# Patient Record
Sex: Male | Born: 1990 | Race: White | Hispanic: No | Marital: Single | Smoking: Former smoker
Health system: Southern US, Community
[De-identification: ages and names within clinical notes are randomized; demographics above are authoritative.]

---

## 2012-09-25 ENCOUNTER — Ambulatory Visit: Payer: Self-pay

## 2012-09-25 ENCOUNTER — Other Ambulatory Visit: Payer: Self-pay | Admitting: Occupational Medicine

## 2012-09-25 DIAGNOSIS — R52 Pain, unspecified: Secondary | ICD-10-CM

## 2013-05-06 ENCOUNTER — Encounter (INDEPENDENT_AMBULATORY_CARE_PROVIDER_SITE_OTHER): Payer: Self-pay

## 2013-05-06 ENCOUNTER — Encounter: Payer: Self-pay | Admitting: Family Medicine

## 2013-05-06 ENCOUNTER — Ambulatory Visit (INDEPENDENT_AMBULATORY_CARE_PROVIDER_SITE_OTHER): Payer: BC Managed Care – PPO | Admitting: Family Medicine

## 2013-05-06 VITALS — BP 102/64 | HR 114 | Temp 99.3°F | Ht 70.5 in | Wt 193.0 lb

## 2013-05-06 DIAGNOSIS — J111 Influenza due to unidentified influenza virus with other respiratory manifestations: Secondary | ICD-10-CM

## 2013-05-06 DIAGNOSIS — R509 Fever, unspecified: Secondary | ICD-10-CM

## 2013-05-06 DIAGNOSIS — R059 Cough, unspecified: Secondary | ICD-10-CM

## 2013-05-06 DIAGNOSIS — J029 Acute pharyngitis, unspecified: Secondary | ICD-10-CM

## 2013-05-06 DIAGNOSIS — R05 Cough: Secondary | ICD-10-CM

## 2013-05-06 DIAGNOSIS — R52 Pain, unspecified: Secondary | ICD-10-CM

## 2013-05-06 LAB — POCT INFLUENZA A/B
Influenza A, POC: NEGATIVE
Influenza B, POC: NEGATIVE

## 2013-05-06 LAB — POCT RAPID STREP A (OFFICE): Rapid Strep A Screen: NEGATIVE

## 2013-05-06 MED ORDER — AZITHROMYCIN 250 MG PO TABS: ORAL_TABLET | ORAL | Status: DC

## 2013-05-06 MED ORDER — OSELTAMIVIR PHOSPHATE 75 MG PO CAPS: 75.0000 mg | ORAL_CAPSULE | Freq: Two times a day (BID) | ORAL | Status: DC

## 2013-05-06 NOTE — Progress Notes (Signed)
   Subjective:    Patient ID: Alex Joseph, male    DOB: [birth year], 23 y.o.   MRN: [id number]  HPI This 23 y.o. male presents for evaluation of fever, malaise, and uri sx's.   Review of Systems No chest pain, SOB, HA, dizziness, vision change, N/V, diarrhea, constipation, dysuria, urinary urgency or frequency, myalgias, arthralgias or rash.     Objective:   Physical Exam  Vital signs noted  Well developed well nourished male.  HEENT - Head atraumatic Normocephalic                Eyes - PERRLA, Conjuctiva - clear Sclera- Clear EOMI                Ears - EAC's Wnl TM's Wnl Gross Hearing WNL                Nose - Nares patent                 Throat - oropharanx wnl Respiratory - Lungs CTA bilateral Cardiac - RRR S1 and S2 without murmur GI - Abdomen soft Nontender and bowel sounds active x 4 Extremities - No edema. Neuro - Grossly intact.      Assessment & Plan:  Cough - Plan: POCT Influenza A/B, POCT rapid strep A, oseltamivir (TAMIFLU) 75 MG capsule, azithromycin (ZITHROMAX) 250 MG tablet  Sore throat - Plan: POCT Influenza A/B, POCT rapid strep A, oseltamivir (TAMIFLU) 75 MG capsule, azithromycin (ZITHROMAX) 250 MG tablet  Fever - Plan: POCT Influenza A/B, POCT rapid strep A, oseltamivir (TAMIFLU) 75 MG capsule, azithromycin (ZITHROMAX) 250 MG tablet  Body aches - Plan: POCT Influenza A/B, POCT rapid strep A, oseltamivir (TAMIFLU) 75 MG capsule, azithromycin (ZITHROMAX) 250 MG tablet  Influenza with other respiratory manifestations - Plan: oseltamivir (TAMIFLU) 75 MG capsule, azithromycin (ZITHROMAX) 250 MG tablet  Push po fluids, rest, tylenol and motrin otc prn as directed for fever, arthralgias, and myalgias.  Follow up prn if sx's continue or persist.  Deatra CanterWilliam J Nyilah Kight FNP

## 2013-10-09 ENCOUNTER — Ambulatory Visit (INDEPENDENT_AMBULATORY_CARE_PROVIDER_SITE_OTHER): Payer: BC Managed Care – PPO | Admitting: Family Medicine

## 2013-10-09 VITALS — BP 128/76 | HR 76 | Temp 98.9°F | Ht 70.5 in | Wt 181.2 lb

## 2013-10-09 DIAGNOSIS — L03311 Cellulitis of abdominal wall: Secondary | ICD-10-CM

## 2013-10-09 DIAGNOSIS — L02219 Cutaneous abscess of trunk, unspecified: Secondary | ICD-10-CM

## 2013-10-09 DIAGNOSIS — L03319 Cellulitis of trunk, unspecified: Secondary | ICD-10-CM

## 2013-10-09 MED ORDER — DOXYCYCLINE HYCLATE 100 MG PO TABS: 100.0000 mg | ORAL_TABLET | Freq: Two times a day (BID) | ORAL | Status: DC

## 2013-10-09 NOTE — Progress Notes (Signed)
   Subjective:    Patient ID: Alex Joseph, male    DOB: 1990-04-14, 23 y.o.   MRN: [id number]  HPI This 23 y.o. male presents for evaluation of sore on left lower abdomen and hip area.  He states he wears a belt and has an infection now at this area.   Review of Systems    No chest pain, SOB, HA, dizziness, vision change, N/V, diarrhea, constipation, dysuria, urinary urgency or frequency, myalgias, arthralgias or rash.  Objective:   Physical Exam  Skin - Left lower abdomen with erythematous region w/o fluctuance      Assessment & Plan:  Cellulitis of abdominal wall - Plan: doxycycline (VIBRA-TABS) 100 MG tablet Po bid x10 days  Deatra CanterWilliam J Kymberlyn Eckford FNP

## 2014-12-23 ENCOUNTER — Ambulatory Visit: Payer: Self-pay | Admitting: Family

## 2015-03-07 IMAGING — CR DG ANKLE COMPLETE 3+V*R*
3 series · 3 of 3 positions shown · non-contrast
Comparison: None

CLINICAL DATA: Twisted right ankle today, lateral pain

RIGHT ANKLE - COMPLETE 3+ VIEW

[view not recorded (1 of 3)]
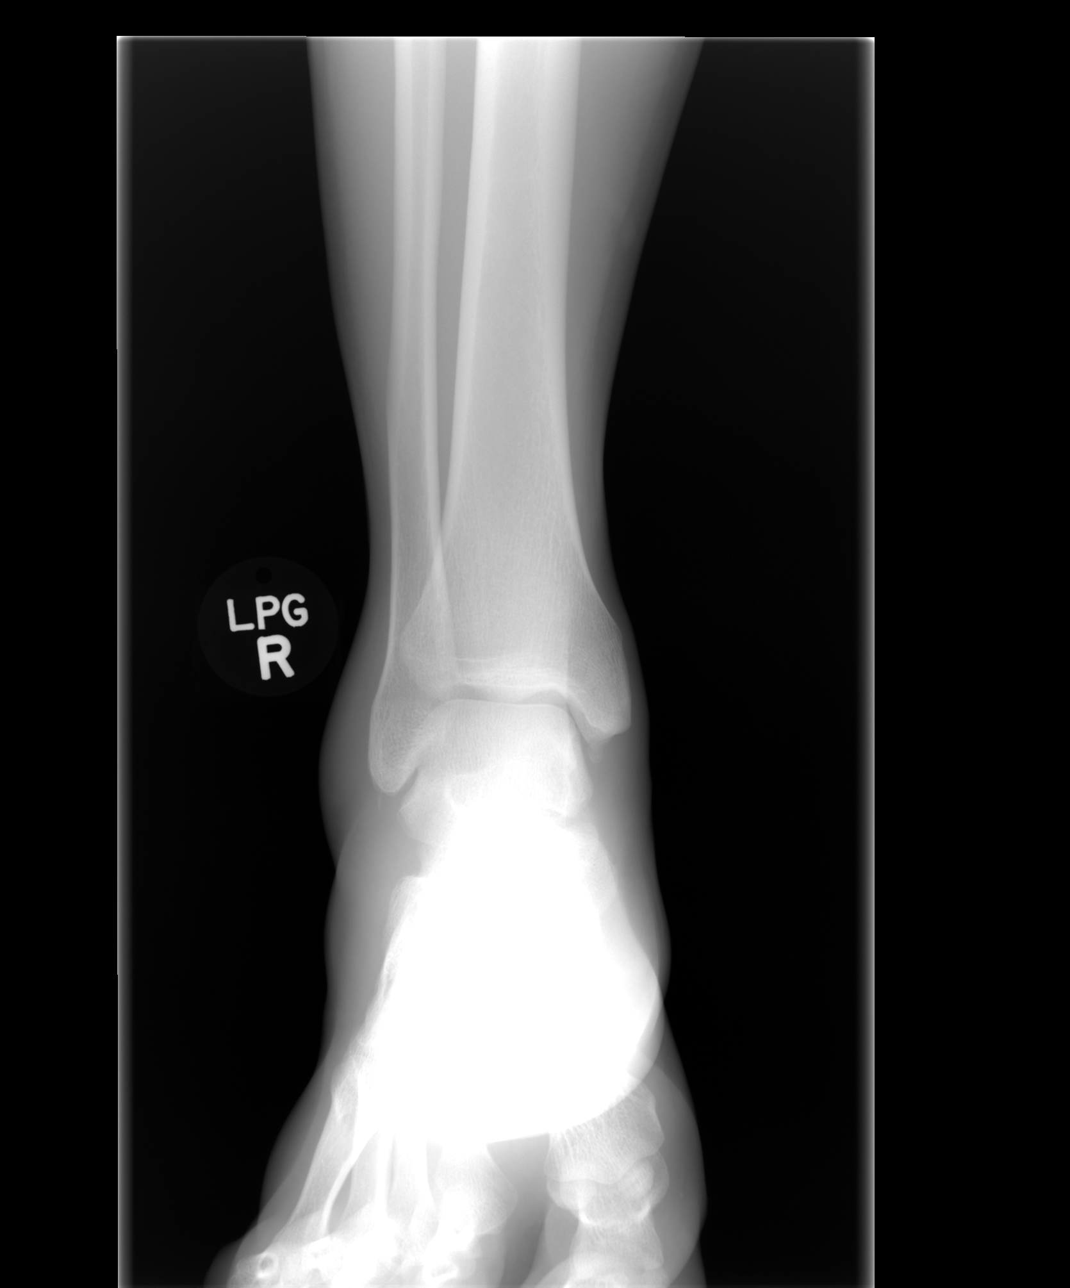

[view not recorded (2 of 3)]
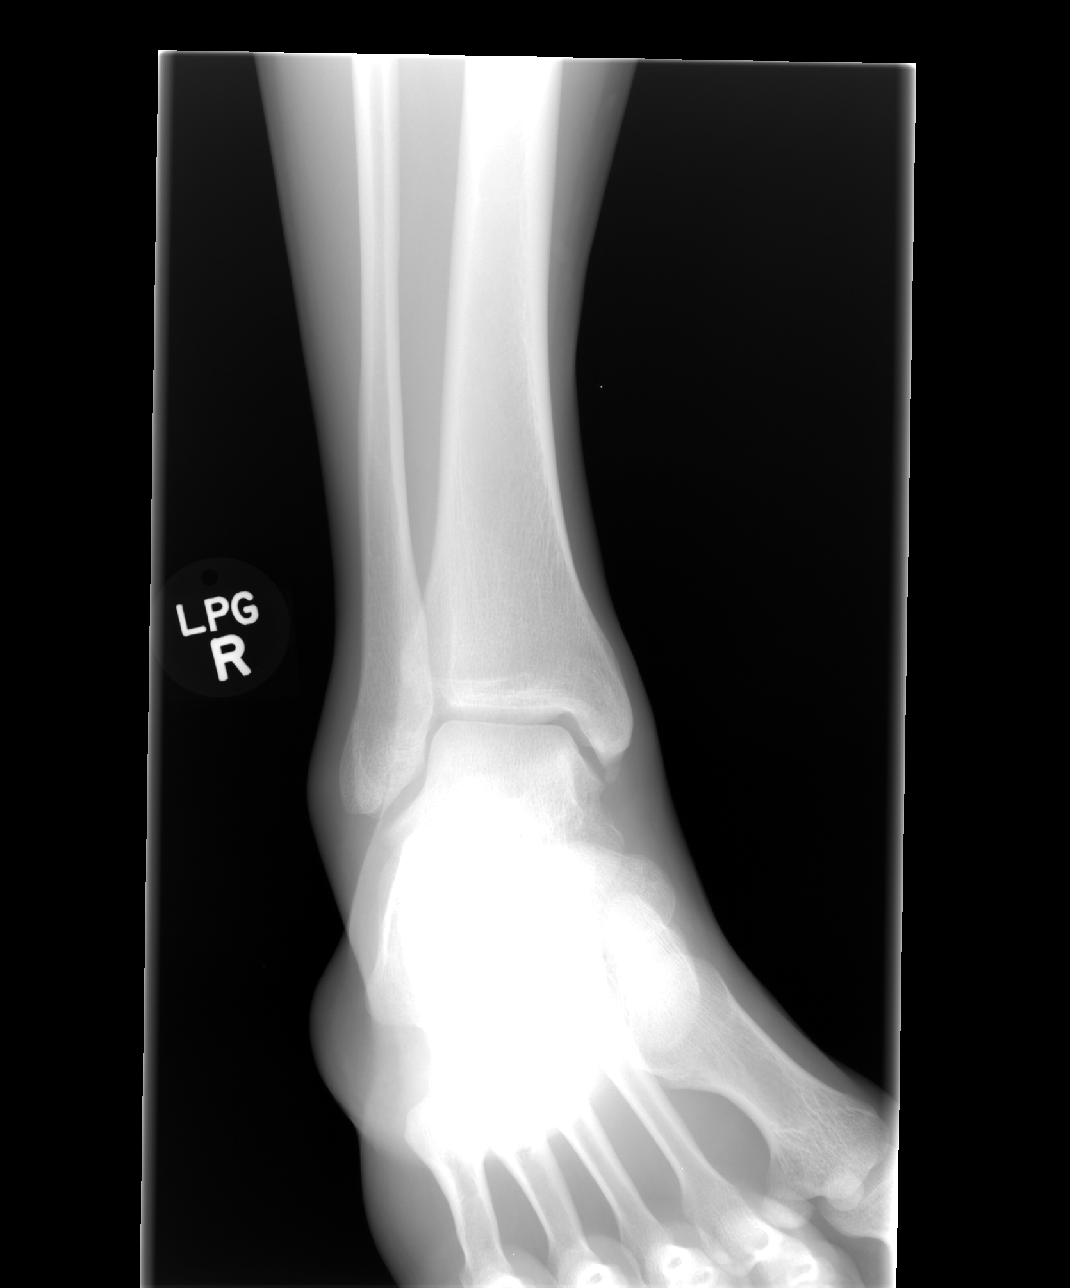

[view not recorded (3 of 3)]
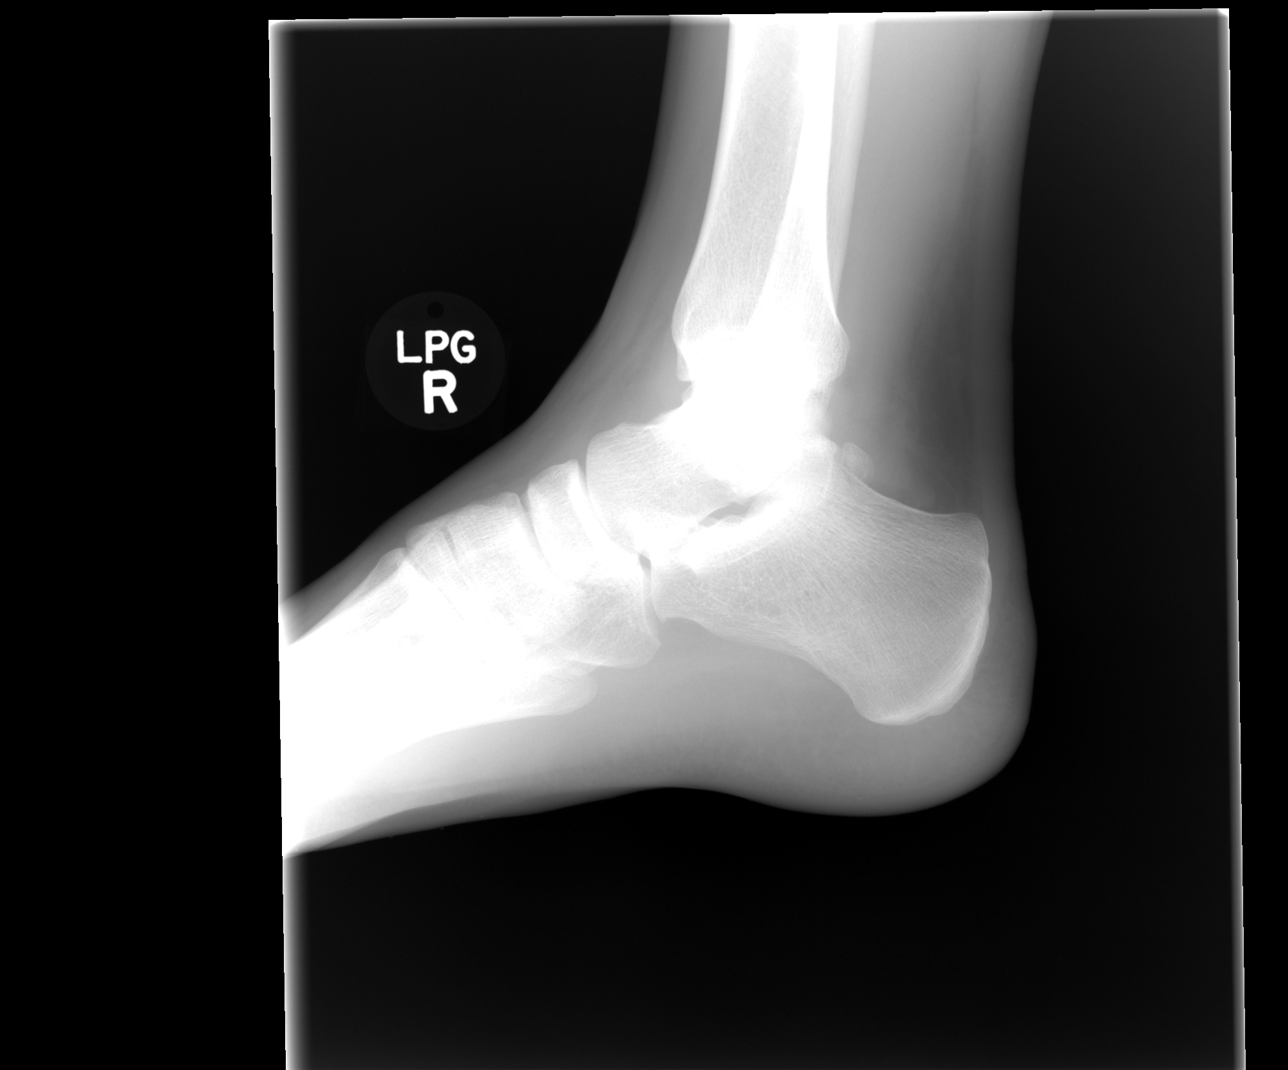

[3 of 3 positions shown; findings below may reference images not displayed]

FINDINGS: Diffuse soft tissue swelling greatest laterally.
Ankle mortise intact.
Osseous mineralization normal.
Old appearing ossicle adjacent to tip of medial malleolus.
Thin linear density is seen inferior to the lateral malleolus,
question artifact versus tiny avulsion fracture.
No additional fracture, dislocation or bone destruction.
IMPRESSION: Linear density inferior to the tip of the lateral malleolus
question artifact versus tiny avulsion fracture.

## 2015-07-12 ENCOUNTER — Encounter: Payer: Self-pay | Admitting: Family

## 2015-07-12 ENCOUNTER — Encounter (INDEPENDENT_AMBULATORY_CARE_PROVIDER_SITE_OTHER): Payer: Self-pay

## 2015-07-12 ENCOUNTER — Ambulatory Visit (INDEPENDENT_AMBULATORY_CARE_PROVIDER_SITE_OTHER): Payer: BLUE CROSS/BLUE SHIELD | Admitting: Family

## 2015-07-12 VITALS — BP 117/79 | HR 80 | Temp 97.7°F | Ht 70.0 in | Wt 203.0 lb

## 2015-07-12 DIAGNOSIS — B002 Herpesviral gingivostomatitis and pharyngotonsillitis: Secondary | ICD-10-CM

## 2015-07-12 MED ORDER — VALACYCLOVIR HCL 1 G PO TABS: 2000.0000 mg | ORAL_TABLET | Freq: Two times a day (BID) | ORAL | Status: DC

## 2015-07-12 NOTE — Progress Notes (Signed)
   Subjective:    Patient ID: Alex Joseph, male    DOB: 1991-01-26, 25 y.o.   MRN: [id number]  HPI PT presents to the office today for a fever blister that started Friday. Pt states it has gotten worse and is having constant aching 3 out 10 pain in his lip. Pt states he woke up this morning and his right lower lip was swollen. Pt has tired abreva with no relief. PT states he has had a fever blister in the past, but it has been "years".    Review of Systems  All other systems reviewed and are negative.      Objective:   Physical Exam  Constitutional: He is oriented to person, place, and time. He appears well-developed and well-nourished. No distress.  HENT:  Head: Normocephalic.  Lesion on right lower lip with mild swelling and yellow crusting  Eyes: Pupils are equal, round, and reactive to light. Right eye exhibits no discharge. Left eye exhibits no discharge.  Neck: Normal range of motion. Neck supple. No thyromegaly present.  Cardiovascular: Normal rate, regular rhythm, normal heart sounds and intact distal pulses.   No murmur heard. Pulmonary/Chest: Effort normal and breath sounds normal. No respiratory distress. He has no wheezes.  Abdominal: Soft. Bowel sounds are normal. He exhibits no distension. There is no tenderness.  Musculoskeletal: Normal range of motion. He exhibits no edema or tenderness.  Neurological: He is alert and oriented to person, place, and time.  Skin: Skin is warm and dry. No rash noted. No erythema.  Psychiatric: He has a normal mood and affect. His behavior is normal. Judgment and thought content normal.  Vitals reviewed.   BP 117/79 mmHg  Pulse 80  Temp(Src) 97.7 F (36.5 C) (Oral)  Ht 5\' 10"  (1.778 m)  Wt 203 lb (92.08 kg)  BMI 29.13 kg/m2       Assessment & Plan:  1. Oral herpes simplex infection -Do not share food or drinks -Discussed risk of spreading- Avoid kissing and oral sex -Ice as needed -Can continue OTC cream  -RTO prn -  valACYclovir (VALTREX) 1000 MG tablet; Take 2 tablets (2,000 mg total) by mouth 2 (two) times daily.  Dispense: 4 tablet; Refill: 1  Jannifer Rodneyhristy Valma Rotenberg, FNP

## 2015-07-12 NOTE — Patient Instructions (Signed)
Cold Sore A cold sore (fever blister) is a skin infection caused by the herpes simplex virus (HSV-1). HSV-1 is closely related to the virus that causes genital herpes (HSV-2), but they are not the same even though both viruses can cause oral and genital infections. Cold sores are small, fluid-filled sores inside of the mouth or on the lips, gums, nose, chin, cheeks, or fingers.  The herpes simplex virus can be easily passed (contagious) to other people through close personal contact, such as kissing or sharing personal items. The virus can also spread to other parts of the body, such as the eyes or genitals. Cold sores are contagious until the sores crust over completely. They often heal within 2 weeks.  Once a person is infected, the herpes simplex virus remains permanently in the body. Therefore, there is no cure for cold sores, and they often recur when a person is tired, stressed, sick, or gets too much sun. Additional factors that can cause a recurrence include hormone changes in menstruation or pregnancy, certain drugs, and cold weather.  CAUSES  Cold sores are caused by the herpes simplex virus. The virus is spread from person to person through close contact, such as through kissing, touching the affected area, or sharing personal items such as lip balm, razors, or eating utensils.  SYMPTOMS  The first infection may not cause symptoms. If symptoms develop, the symptoms often go through different stages. Here is how a cold sore develops:   Tingling, itching, or burning is felt 1-2 days before the outbreak.   Fluid-filled blisters appear on the lips, inside the mouth, nose, or on the cheeks.   The blisters start to ooze clear fluid.   The blisters dry up and a yellow crust appears in its place.   The crust falls off.  Symptoms depend on whether it is the initial outbreak or a recurrence. Some other symptoms with the first outbreak may include:   Fever.   Sore throat.   Headache.    Muscle aches.   Swollen neck glands.  DIAGNOSIS  A diagnosis is often made based on your symptoms and looking at the sores. Sometimes, a sore may be swabbed and then examined in the lab to make a final diagnosis. If the sores are not present, blood tests can find the herpes simplex virus.  TREATMENT  There is no cure for cold sores and no vaccine for the herpes simplex virus. Within 2 weeks, most cold sores go away on their own without treatment. Medicines cannot make the infection go away, but medicine can help relieve some of the pain associated with the sores, can work to stop the virus from multiplying, and can also shorten healing time. Medicine may be in the form of creams, gels, pills, or a shot.  HOME CARE INSTRUCTIONS   Only take over-the-counter or prescription medicines for pain, discomfort, or fever as directed by your caregiver. Do not use aspirin.   Use a cotton-tip swab to apply creams or gels to your sores.   Do not touch the sores or pick the scabs. Wash your hands often. Do not touch your eyes without washing your hands first.   Avoid kissing, oral sex, and sharing personal items until sores heal.   Apply an ice pack on your sores for 10-15 minutes to ease any discomfort.   Avoid hot, cold, or salty foods because they may hurt your mouth. Eat a soft, bland diet to avoid irritating the sores. Use a straw to drink   if you have pain when drinking out of a glass.   Keep sores clean and dry to prevent an infection of other tissues.   Avoid the sun and limit stress if these things trigger outbreaks. If sun causes cold sores, apply sunscreen on the lips before being out in the sun.  SEEK MEDICAL CARE IF:   You have a fever or persistent symptoms for more than 2-3 days.   You have a fever and your symptoms suddenly get worse.   You have pus, not clear fluid, coming from the sores.   You have redness that is spreading.   You have pain or irritation in your  eye.   You get sores on your genitals.   Your sores do not heal within 2 weeks.   You have a weakened immune system.   You have frequent recurrences of cold sores.  MAKE SURE YOU:   Understand these instructions.  Will watch your condition.  Will get help right away if you are not doing well or get worse.   This information is not intended to replace advice given to you by your health care provider. Make sure you discuss any questions you have with your health care provider.   Document Released: 02/17/2000 Document Revised: 03/12/2014 Document Reviewed: 07/04/2011 Elsevier Interactive Patient Education 2016 [employer] Inc[employer]  

## 2015-07-22 DIAGNOSIS — Z6829 Body mass index (BMI) 29.0-29.9, adult: Secondary | ICD-10-CM | POA: Diagnosis not present

## 2015-07-22 DIAGNOSIS — Z0000 Encounter for general adult medical examination without abnormal findings: Secondary | ICD-10-CM | POA: Diagnosis not present

## 2015-09-22 DIAGNOSIS — R1084 Generalized abdominal pain: Secondary | ICD-10-CM | POA: Diagnosis not present

## 2015-09-22 DIAGNOSIS — K5900 Constipation, unspecified: Secondary | ICD-10-CM | POA: Diagnosis not present

## 2015-09-22 DIAGNOSIS — R1010 Upper abdominal pain, unspecified: Secondary | ICD-10-CM | POA: Diagnosis not present

## 2015-09-30 DIAGNOSIS — Z0000 Encounter for general adult medical examination without abnormal findings: Secondary | ICD-10-CM | POA: Diagnosis not present

## 2015-10-07 ENCOUNTER — Encounter: Payer: Self-pay | Admitting: Family Medicine

## 2015-10-07 ENCOUNTER — Ambulatory Visit (INDEPENDENT_AMBULATORY_CARE_PROVIDER_SITE_OTHER): Payer: BLUE CROSS/BLUE SHIELD | Admitting: Family Medicine

## 2015-10-07 VITALS — BP 138/87 | HR 67 | Temp 97.8°F | Ht 70.0 in | Wt 198.2 lb

## 2015-10-07 DIAGNOSIS — R1011 Right upper quadrant pain: Secondary | ICD-10-CM

## 2015-10-07 MED ORDER — RANITIDINE HCL 150 MG PO TABS: 150.0000 mg | ORAL_TABLET | Freq: Two times a day (BID) | ORAL | 2 refills | Status: DC

## 2015-10-07 NOTE — Progress Notes (Signed)
BP 138/87 (BP Location: Right Arm, Patient Position: Sitting, Cuff Size: Normal)   Pulse 67   Temp 97.8 F (36.6 C) (Oral)   Ht '5\' 10"'$  (1.778 m)   Wt 198 lb 3.2 oz (89.9 kg)   BMI 28.44 kg/m    Subjective:    Patient ID: Alex Joseph, male    DOB: 01-Feb-1991, 25 y.o.   MRN: [id number]  HPI: Alex Joseph is a 25 y.o. male presenting on 10/07/2015 for Abdominal Pain (RUQ x 2 weeks)   HPI Right upper quadrant abdominal pain Patient comes in with complaints of right upper quadrant tenderness that's been going on intermittently for the past 2 weeks. The tenderness is described as an achy sensation in his low grade 2 out of 10 when it occurs. He is also had some sweating and flushing associated with it. He denies any fevers or chills or if he denies any nausea or vomiting or constipation or diarrhea or blood in his stool. He denies any urinary issues or hematuria or dysuria. He has never had this pain previously in his life. The pain does not radiate anywhere else. He does not know of anything that makes it better or worse. He cannot recall if it's associated with eating. He denies any excess belching or burping or any heartburn.   Relevant past medical, surgical, family and social history reviewed and updated as indicated. Interim medical history since our last visit reviewed. Allergies and medications reviewed and updated.  Review of Systems  Constitutional: Negative for fever.  HENT: Negative for ear discharge and ear pain.   Eyes: Negative for discharge and visual disturbance.  Respiratory: Negative for shortness of breath and wheezing.   Cardiovascular: Negative for chest pain and leg swelling.  Gastrointestinal: Positive for abdominal pain. Negative for blood in stool, constipation, diarrhea, nausea, rectal pain and vomiting.  Genitourinary: Negative for difficulty urinating.  Musculoskeletal: Negative for back pain and gait problem.  Skin: Negative for rash.  Neurological: Negative  for syncope, light-headedness and headaches.  All other systems reviewed and are negative.   Per HPI unless specifically indicated above     Medication List       Accurate as of 10/07/15  3:23 PM. Always use your most recent med list.          ranitidine 150 MG tablet Commonly known as:  ZANTAC Take 1 tablet (150 mg total) by mouth 2 (two) times daily.          Objective:    BP 138/87 (BP Location: Right Arm, Patient Position: Sitting, Cuff Size: Normal)   Pulse 67   Temp 97.8 F (36.6 C) (Oral)   Ht '5\' 10"'$  (1.778 m)   Wt 198 lb 3.2 oz (89.9 kg)   BMI 28.44 kg/m   Wt Readings from Last 3 Encounters:  10/07/15 198 lb 3.2 oz (89.9 kg)  07/12/15 203 lb (92.1 kg)  10/09/13 181 lb 3.2 oz (82.2 kg)    Physical Exam  Constitutional: He is oriented to person, place, and time. He appears well-developed and well-nourished. No distress.  Eyes: Conjunctivae and EOM are normal. Pupils are equal, round, and reactive to light. Right eye exhibits no discharge. No scleral icterus.  Neck: Neck supple. No thyromegaly present.  Cardiovascular: Normal rate, regular rhythm, normal heart sounds and intact distal pulses.   No murmur heard. Pulmonary/Chest: Effort normal and breath sounds normal. No respiratory distress. He has no wheezes.  Abdominal: Soft. Bowel sounds are normal. He exhibits  no distension. There is tenderness (Right upper quadrant mild tenderness to palpation. Negative Murphy sign. No rebound or t guarding. No CVA tenderness). There is no rebound and no guarding.  Musculoskeletal: Normal range of motion. He exhibits no edema.  Lymphadenopathy:    He has no cervical adenopathy.  Neurological: He is alert and oriented to person, place, and time. Coordination normal.  Skin: Skin is warm and dry. No rash noted. He is not diaphoretic.  Psychiatric: He has a normal mood and affect. His behavior is normal.  Nursing note and vitals reviewed.     Assessment & Plan:   Problem  List Items Addressed This Visit    None    Visit Diagnoses    Abdominal pain, right upper quadrant    -  Primary   Intermittent for the past 2 weeks. Patient has sweating and flushing associated with it sometimes.   Relevant Medications   ranitidine (ZANTAC) 150 MG tablet   Other Relevant Orders   US Abdomen Limited RUQ   CBC with Differential/Platelet   CMP14+EGFR   Lipase       Follow up plan: Return if symptoms worsen or fail to improve.  Counseling provided for all of the vaccine components Orders Placed This Encounter  Procedures  . US Abdomen Limited RUQ  . CBC with Differential/Platelet  . CMP14+EGFR  . Lipase    Caryl Pina, MD [facility] 10/07/2015, 3:23 PM

## 2015-10-08 LAB — CMP14+EGFR
ALK PHOS: 64 IU/L (ref 39–117)
ALT: 13 IU/L (ref 0–44)
AST: 16 IU/L (ref 0–40)
Albumin/Globulin Ratio: 2.1 (ref 1.2–2.2)
Albumin: 5 g/dL (ref 3.5–5.5)
BUN/Creatinine Ratio: 17 (ref 9–20)
BUN: 18 mg/dL (ref 6–20)
Bilirubin Total: 0.4 mg/dL (ref 0.0–1.2)
CO2: 25 mmol/L (ref 18–29)
CREATININE: 1.03 mg/dL (ref 0.76–1.27)
Calcium: 9.9 mg/dL (ref 8.7–10.2)
Chloride: 97 mmol/L (ref 96–106)
GFR calc Af Amer: 118 mL/min/{1.73_m2} (ref >59)
GFR calc non Af Amer: 102 mL/min/{1.73_m2} (ref >59)
GLUCOSE: 107 mg/dL — AB (ref 65–99)
Globulin, Total: 2.4 g/dL (ref 1.5–4.5)
Potassium: 4.1 mmol/L (ref 3.5–5.2)
Sodium: 137 mmol/L (ref 134–144)
Total Protein: 7.4 g/dL (ref 6.0–8.5)

## 2015-10-08 LAB — CBC WITH DIFFERENTIAL/PLATELET
BASOS ABS: 0 10*3/uL (ref 0.0–0.2)
BASOS: 1 %
EOS (ABSOLUTE): 0.2 10*3/uL (ref 0.0–0.4)
EOS: 3 %
HEMOGLOBIN: 14.1 g/dL (ref 12.6–17.7)
Hematocrit: 42.4 % (ref 37.5–51.0)
Immature Grans (Abs): 0 10*3/uL (ref 0.0–0.1)
Immature Granulocytes: 0 %
LYMPHS: 41 %
Lymphocytes Absolute: 2.7 10*3/uL (ref 0.7–3.1)
MCH: 29.6 pg (ref 26.6–33.0)
MCHC: 33.3 g/dL (ref 31.5–35.7)
MCV: 89 fL (ref 79–97)
MONOCYTES: 7 %
Monocytes Absolute: 0.5 10*3/uL (ref 0.1–0.9)
NEUTROS ABS: 3.2 10*3/uL (ref 1.4–7.0)
Neutrophils: 48 %
PLATELETS: 369 10*3/uL (ref 150–379)
RBC: 4.77 x10E6/uL (ref 4.14–5.80)
RDW: 13 % (ref 12.3–15.4)
WBC: 6.6 10*3/uL (ref 3.4–10.8)

## 2015-10-08 LAB — LIPASE: Lipase: 22 U/L (ref 0–59)

## 2015-10-10 ENCOUNTER — Telehealth: Payer: Self-pay

## 2015-10-10 NOTE — Telephone Encounter (Signed)
[facility]  [facility] Arrival time of 10:45 and nothing to eat or drink after midnight

## 2015-10-13 ENCOUNTER — Ambulatory Visit (HOSPITAL_COMMUNITY): Admission: RE | Admit: 2015-10-13 | Payer: BLUE CROSS/BLUE SHIELD | Source: Ambulatory Visit

## 2015-10-31 ENCOUNTER — Telehealth: Payer: Self-pay | Admitting: Family Medicine

## 2015-10-31 NOTE — Telephone Encounter (Signed)
Pt was scheduled and No-Showed

## 2015-10-31 NOTE — Telephone Encounter (Signed)
I believe the order is already In as I put it in my last visit but please go ahead and set up patient for right upper quadrant ultrasound

## 2016-02-20 ENCOUNTER — Telehealth: Payer: Self-pay | Admitting: Family Medicine

## 2016-02-20 NOTE — Telephone Encounter (Signed)
Denied.

## 2016-03-19 ENCOUNTER — Encounter: Payer: Self-pay | Admitting: Family Medicine

## 2016-03-19 ENCOUNTER — Ambulatory Visit (INDEPENDENT_AMBULATORY_CARE_PROVIDER_SITE_OTHER): Payer: BLUE CROSS/BLUE SHIELD | Admitting: Family Medicine

## 2016-03-19 VITALS — BP 139/81 | HR 74 | Temp 97.8°F | Ht 70.0 in | Wt 212.2 lb

## 2016-03-19 DIAGNOSIS — J069 Acute upper respiratory infection, unspecified: Secondary | ICD-10-CM | POA: Diagnosis not present

## 2016-03-19 DIAGNOSIS — B9789 Other viral agents as the cause of diseases classified elsewhere: Secondary | ICD-10-CM

## 2016-03-19 NOTE — Progress Notes (Signed)
BP 139/81   Pulse 74   Temp 97.8 F (36.6 C) (Oral)   Ht 5\' 10"  (1.778 m)   Wt 212 lb 3.2 oz (96.3 kg)   BMI 30.45 kg/m    Subjective:    Patient ID: Alex Joseph, male    DOB: 02-08-1991, 26 y.o.   MRN: [id number]  HPI: Alex Pettyravis Reckner is a 26 y.o. male presenting on 03/19/2016 for Nasal Congestion (for 2 weeks); Sore Throat (2 days ago); Fatigue; and Cough (productive at times, no known fever, OTC mucinex, )   HPI Nasal congestion and sore throat Patient has been having nasal congestion and sore throat and a cough that has been going on for the past couple weeks but has increased over the past couple days. He denies any fevers or chills or shortness of breath and wheezing. His cough is mostly nonproductive at this point. He does say that he was exposed to his father having strep a week ago but he has not had any fevers or any other symptoms besides sore throat that fit when his father had. His sore throat started just 2 days ago because of the drainage that he was having down back of throat  Relevant past medical, surgical, family and social history reviewed and updated as indicated. Interim medical history since our last visit reviewed. Allergies and medications reviewed and updated.  Review of Systems  Constitutional: Negative for chills and fever.  HENT: Positive for congestion, postnasal drip, rhinorrhea, sinus pressure, sneezing and sore throat. Negative for ear discharge, ear pain and voice change.   Eyes: Negative for pain, discharge, redness and visual disturbance.  Respiratory: Positive for cough. Negative for shortness of breath and wheezing.   Cardiovascular: Negative for chest pain and leg swelling.  Musculoskeletal: Negative for gait problem.  Skin: Negative for rash.  All other systems reviewed and are negative.   Per HPI unless specifically indicated above   Allergies as of 03/19/2016      Reactions   Cefzil [cefprozil]       Medication List    as of 03/19/2016   4:12 PM   You have not been prescribed any medications.        Objective:    BP 139/81   Pulse 74   Temp 97.8 F (36.6 C) (Oral)   Ht 5\' 10"  (1.778 m)   Wt 212 lb 3.2 oz (96.3 kg)   BMI 30.45 kg/m   Wt Readings from Last 3 Encounters:  03/19/16 212 lb 3.2 oz (96.3 kg)  10/07/15 198 lb 3.2 oz (89.9 kg)  07/12/15 203 lb (92.1 kg)    Physical Exam  Constitutional: He is oriented to person, place, and time. He appears well-developed and well-nourished. No distress.  HENT:  Right Ear: Tympanic membrane, external ear and ear canal normal.  Left Ear: Tympanic membrane, external ear and ear canal normal.  Nose: Mucosal edema and rhinorrhea present. No sinus tenderness. No epistaxis. Right sinus exhibits maxillary sinus tenderness. Right sinus exhibits no frontal sinus tenderness. Left sinus exhibits maxillary sinus tenderness. Left sinus exhibits no frontal sinus tenderness.  Mouth/Throat: Uvula is midline and mucous membranes are normal. Posterior oropharyngeal edema and posterior oropharyngeal erythema present. No oropharyngeal exudate or tonsillar abscesses.  Eyes: Conjunctivae are normal. Right eye exhibits no discharge. Left eye exhibits no discharge. No scleral icterus.  Neck: Neck supple. No thyromegaly present.  Cardiovascular: Normal rate, regular rhythm, normal heart sounds and intact distal pulses.   No murmur heard. Pulmonary/Chest:  Effort normal and breath sounds normal. No respiratory distress. He has no wheezes. He has no rales.  Musculoskeletal: Normal range of motion. He exhibits no edema.  Lymphadenopathy:    He has no cervical adenopathy.  Neurological: He is alert and oriented to person, place, and time. Coordination normal.  Skin: Skin is warm and dry. No rash noted. He is not diaphoretic.  Psychiatric: He has a normal mood and affect. His behavior is normal.  Nursing note and vitals reviewed.     Assessment & Plan:   Problem List Items Addressed This Visit     None    Visit Diagnoses    Viral upper respiratory infection    -  Primary   Recommended Flonase and Mucinex and an antihistamine and nasal saline sprays, call back if worsens or develops any fevers.       Follow up plan: Return if symptoms worsen or fail to improve.  Counseling provided for all of the vaccine components No orders of the defined types were placed in this encounter.   Arville Care, MD [facility] 03/19/2016, 4:12 PM

## 2016-09-21 ENCOUNTER — Ambulatory Visit (INDEPENDENT_AMBULATORY_CARE_PROVIDER_SITE_OTHER): Payer: BLUE CROSS/BLUE SHIELD | Admitting: Family Medicine

## 2016-09-21 ENCOUNTER — Encounter: Payer: Self-pay | Admitting: Family Medicine

## 2016-09-21 VITALS — BP 129/82 | HR 64 | Temp 98.0°F | Ht 70.5 in | Wt 210.0 lb

## 2016-09-21 DIAGNOSIS — Z111 Encounter for screening for respiratory tuberculosis: Secondary | ICD-10-CM

## 2016-09-21 DIAGNOSIS — Z0000 Encounter for general adult medical examination without abnormal findings: Secondary | ICD-10-CM | POA: Diagnosis not present

## 2016-09-21 NOTE — Progress Notes (Signed)
   Subjective:    Patient ID: Alex Joseph, male    DOB: 02/11/1991, 26 y.o.   MRN: [id number]  HPI Patient here today for college form and physical. 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 Basically he is a healthy young man. Past medical history is unremarkable. Form will be completed and scanned into the record    There are no active problems to display for this patient.  No outpatient encounter prescriptions on file as of 09/21/2016.   No facility-administered encounter medications on file as of 09/21/2016.       Review of Systems  Constitutional: Negative.   HENT: Negative.   Eyes: Negative.   Respiratory: Negative.   Cardiovascular: Negative.   Gastrointestinal: Negative.   Endocrine: Negative.   Genitourinary: Negative.   Musculoskeletal: Negative.   Skin: Negative.   Allergic/Immunologic: Negative.   Neurological: Negative.   Hematological: Negative.   Psychiatric/Behavioral: Negative.   All other systems reviewed and are negative.      Objective:   Physical Exam  Constitutional: He is oriented to person, place, and time. He appears well-developed and well-nourished.  HENT:  Head: Normocephalic.  Right Ear: External ear normal.  Left Ear: External ear normal.  Mouth/Throat: Oropharynx is clear and moist.  Eyes: Pupils are equal, round, and reactive to light.  Neck: Normal range of motion. Neck supple.  Cardiovascular: Normal rate, regular rhythm and normal heart sounds.   Pulmonary/Chest: Effort normal and breath sounds normal.  Abdominal: Soft.  Genitourinary: Penis normal.  Musculoskeletal: Normal range of motion.  Neurological: He is alert and oriented to person, place, and time. He has normal reflexes.  Skin: Skin is warm. No rash noted.  Psychiatric: He has a normal mood and affect. His behavior is normal.   BP 129/82 (BP Location: Left Arm)   Pulse 64   Temp 98 F (36.7 C) (Oral)   Ht 5' 10.5" (1.791 m)   Wt 210  lb (95.3 kg)   BMI 29.71 kg/m         Assessment & Plan:  Physical exam for college. No abnormal findings. Patient did have hemoglobin and dip urinalysis which were negative for protein sugar with hemoglobin 13.9. TB skin test is also applied today

## 2016-09-21 NOTE — Addendum Note (Signed)
Addended by: Magdalene RiverBULLINS, Tyah Acord H on: 09/21/2016 10:59 AM   Modules accepted: Orders

## 2016-09-24 LAB — TB SKIN TEST
INDURATION: 0 mm
TB Skin Test: NEGATIVE

## 2016-09-24 LAB — URINALYSIS
Bilirubin, UA: NEGATIVE
Glucose, UA: NEGATIVE
KETONES UA: NEGATIVE
LEUKOCYTES UA: NEGATIVE
NITRITE UA: NEGATIVE
Protein, UA: NEGATIVE
Specific Gravity, UA: 1.02 (ref 1.005–1.030)
Urobilinogen, Ur: 0.2 mg/dL (ref 0.2–1.0)
pH, UA: 6.5 (ref 5.0–7.5)

## 2016-09-24 LAB — HEMOGLOBIN, FINGERSTICK: HEMOGLOBIN: 13.9 g/dL (ref 12.6–17.7)

## 2016-12-31 ENCOUNTER — Ambulatory Visit (INDEPENDENT_AMBULATORY_CARE_PROVIDER_SITE_OTHER): Payer: BLUE CROSS/BLUE SHIELD | Admitting: Family Medicine

## 2016-12-31 ENCOUNTER — Encounter: Payer: Self-pay | Admitting: Family Medicine

## 2016-12-31 VITALS — BP 135/83 | HR 78 | Temp 98.7°F | Ht 70.5 in | Wt 211.0 lb

## 2016-12-31 DIAGNOSIS — B9689 Other specified bacterial agents as the cause of diseases classified elsewhere: Secondary | ICD-10-CM | POA: Diagnosis not present

## 2016-12-31 DIAGNOSIS — J208 Acute bronchitis due to other specified organisms: Secondary | ICD-10-CM

## 2016-12-31 MED ORDER — BENZONATATE 200 MG PO CAPS: 200.0000 mg | ORAL_CAPSULE | Freq: Three times a day (TID) | ORAL | 0 refills | Status: DC | PRN

## 2016-12-31 MED ORDER — LEVOFLOXACIN 500 MG PO TABS: 500.0000 mg | ORAL_TABLET | Freq: Every day | ORAL | 0 refills | Status: DC

## 2016-12-31 NOTE — Progress Notes (Signed)
Chief Complaint  Patient presents with  . Cough    pt here today c/o cough for about 2 weeks now and congestion    HPI  Patient presents today for Patient presents with upper respiratory congestion. NoRhinorrhea. There is moderate sore throat. Patient reports coughing frequently as well.  Occasional yellow sputum noted. There is no fever, chills or sweats. However he got hot and dizzy last night The patient denies being short of breath. Onset was 2 weeks ago. Not getting better. Tried OTCs without improvement.  PMH: Smoking status noted ROS: Per HPI  Objective: BP 135/83   Pulse 78   Temp 98.7 F (37.1 C) (Oral)   Ht 5' 10.5" (1.791 m)   Wt 211 lb (95.7 kg)   BMI 29.85 kg/m  Gen: NAD, alert, cooperative with exam HEENT: NCAT, Nasal passages swollen,  CV: RRR, good S1/S2, no murmur Resp: Bronchitis changes with scattered wheezes, non-labored Ext: No edema, warm Neuro: Alert and oriented, No gross deficits  Assessment and plan:  1. Acute bacterial bronchitis     Meds ordered this encounter  Medications  . levofloxacin (LEVAQUIN) 500 MG tablet    Sig: Take 1 tablet (500 mg total) by mouth daily.    Dispense:  7 tablet    Refill:  0  . benzonatate (TESSALON) 200 MG capsule    Sig: Take 1 capsule (200 mg total) by mouth 3 (three) times daily as needed for cough.    Dispense:  20 capsule    Refill:  0    After the patient left  it occurred to me that this might work better than OTC cough med.  Double check with him before filling it thanks, WS    No orders of the defined types were placed in this encounter.   Follow up as needed.  Mechele ClaudeWarren Lynetta Tomczak, MD

## 2017-04-12 ENCOUNTER — Ambulatory Visit (INDEPENDENT_AMBULATORY_CARE_PROVIDER_SITE_OTHER): Payer: BLUE CROSS/BLUE SHIELD | Admitting: Pediatrics

## 2017-04-12 ENCOUNTER — Encounter: Payer: Self-pay | Admitting: Pediatrics

## 2017-04-12 VITALS — BP 130/89 | HR 62 | Temp 98.1°F | Ht 70.5 in | Wt 210.0 lb

## 2017-04-12 DIAGNOSIS — J020 Streptococcal pharyngitis: Secondary | ICD-10-CM

## 2017-04-12 DIAGNOSIS — J029 Acute pharyngitis, unspecified: Secondary | ICD-10-CM

## 2017-04-12 LAB — RAPID STREP SCREEN (MED CTR MEBANE ONLY): STREP GP A AG, IA W/REFLEX: POSITIVE — AB

## 2017-04-12 MED ORDER — AZITHROMYCIN 250 MG PO TABS: ORAL_TABLET | ORAL | 0 refills | Status: DC

## 2017-04-12 NOTE — Progress Notes (Signed)
  Subjective:   Patient ID: Alex Joseph, male    DOB: 1990/10/24, 27 y.o.   MRN: [id number] CC: Sore Throat and Nasal Congestion  HPI: Alex Joseph is a 27 y.o. male presenting for Sore Throat and Nasal Congestion  No fevers, sore throat with swallowing, coughing more in the morning, some nasal discharge Appetite has been ok   Relevant past medical, surgical, family and social history reviewed. Allergies and medications reviewed and updated. Social History   Tobacco Use  Smoking Status Former Smoker  . Packs/day: 0.25  . Types: Cigarettes  . Start date: 05/07/2011  . Last attempt to quit: 07/04/2014  . Years since quitting: 2.7  Smokeless Tobacco Former NeurosurgeonUser   ROS: Per HPI   Objective:    BP 130/89   Pulse 62   Temp 98.1 F (36.7 C) (Oral)   Ht 5' 10.5" (1.791 m)   Wt 210 lb (95.3 kg)   BMI 29.71 kg/m   Wt Readings from Last 3 Encounters:  04/12/17 210 lb (95.3 kg)  12/31/16 211 lb (95.7 kg)  09/21/16 210 lb (95.3 kg)    Gen: NAD, alert, cooperative with exam, NCAT EYES: EOMI, no conjunctival injection, or no icterus ENT:  TMs dull gray b/l, OP with erythema LYMPH: no cervical LAD CV: NRRR, normal S1/S2, no murmur, distal pulses 2+ b/l Resp: CTABL, no wheezes, normal WOB Neuro: Alert and oriented MSK: normal muscle bulk  Assessment & Plan:  Alex Joseph was seen today for sore throat and nasal congestion.  Diagnoses and all orders for this visit:  Strep pharyngitis Strep positive, treat with below -     azithromycin (ZITHROMAX) 250 MG tablet; Take 2 the first day and then one each day after.  Sore throat -     Culture, Group A Strep -     Rapid Strep Screen (Not at [facility])   Follow up plan: Return if symptoms worsen or fail to improve. Rex Krasarol Vincent, MD [facility]

## 2017-04-12 NOTE — Patient Instructions (Signed)
Netipot with distilled water 2-3 times a day to clear out sinuses Or Normal saline nasal spray Flonase steroid nasal spray for congestion as needed Ibuprofen 600mg  three times a day Lots of fluids  Start antibiotic if you start to get worse, have fevers, sinus pressure is worsening, not improving in 2-3 days.

## 2017-09-27 ENCOUNTER — Ambulatory Visit (INDEPENDENT_AMBULATORY_CARE_PROVIDER_SITE_OTHER): Payer: BLUE CROSS/BLUE SHIELD | Admitting: Pediatrics

## 2017-09-27 ENCOUNTER — Encounter: Payer: Self-pay | Admitting: Pediatrics

## 2017-09-27 VITALS — BP 127/84 | HR 70 | Temp 97.7°F | Ht 70.5 in | Wt 209.2 lb

## 2017-09-27 DIAGNOSIS — R1031 Right lower quadrant pain: Secondary | ICD-10-CM

## 2017-09-27 NOTE — Progress Notes (Addendum)
  Subjective:   Patient ID: Alex Joseph, male    DOB: 04/15/1990, 27 y.o.   MRN: [id number] CC: Groin Pain (3 weeks)  HPI: Alex Joseph is a 27 y.o. male   Ongoing dull achy right groin versus right testicle pain for past 3 weeks.  Comes on randomly.  Sometimes in the groin, sometimes seems to be in right testicle.  Depending on how he sits sometimes notices it more, has to change position to get more comfortable.  He notices it more sometimes when he thinks about it.  Straining or physical activity does not seem to make it worse.  He has not taken any medicine for it.  No swelling or tenderness in the right testicle.  He recently started a job, does not have insurance right now.  He will get insurance in a couple of months.  No new sexual partners.  Never been tested for STIs.  No rash in groin, no lumps or bumps in groin or testicle.  No fevers, normal appetite, passing stool regularly, no pain with stooling, no trouble with urination, no dysuria, no penile discharge.  No history of kidney stones.  Relevant past medical, surgical, family and social history reviewed. Allergies and medications reviewed and updated. Social History   Tobacco Use  Smoking Status Former Smoker  . Packs/day: 0.25  . Types: Cigarettes  . Start date: 05/07/2011  . Last attempt to quit: 07/04/2014  . Years since quitting: 3.2  Smokeless Tobacco Former User   ROS: Per HPI   Objective:    BP 127/84   Pulse 70   Temp 97.7 F (36.5 C) (Oral)   Ht 5' 10.5" (1.791 m)   Wt 209 lb 3.2 oz (94.9 kg)   BMI 29.59 kg/m   Wt Readings from Last 3 Encounters:  09/27/17 209 lb 3.2 oz (94.9 kg)  04/12/17 210 lb (95.3 kg)  12/31/16 211 lb (95.7 kg)    Gen: NAD, alert, cooperative with exam, NCAT EYES: EOMI, no conjunctival injection, or no icterus LYMPH: no R sided inguinal LAD CV: NRRR, normal S1/S2, no murmur, distal pulses 2+ b/l Resp: CTABL, no wheezes, normal WOB Abd: +BS, soft, NTND. no guarding or  organomegaly Neuro: Alert and oriented, strength equal b/l UE and LE, coordination grossly normal GU: Normal external male genitalia. Testes equal size, no tenderness of testicles, no tenderness epididymus b/l, no redness or swelling. No bulges in inguinal canal or groin with straining. No rash.  Assessment & Plan:  Alex Joseph was seen today for groin pain.  Diagnoses and all orders for this visit:  Groin pain, right Recommend STI testing, UA, NSAIDs for possible epididymitis, could be from urine reflux, if not improving will refer to urology. No hernia on exam. If any worsening or new symptoms let Alex Joseph know.  Patient wants to wait on testing because of no insurance, but plans on having insurance shortly.  Return precautions discussed.  Follow up plan: Return in about 1 month (around 10/25/2017) if not resolved. Alex Krasarol Vincent, MD [facility]

## 2017-11-28 ENCOUNTER — Ambulatory Visit: Payer: Self-pay | Admitting: Nurse Practitioner

## 2017-12-04 ENCOUNTER — Encounter: Payer: Self-pay | Admitting: Family Medicine

## 2017-12-04 ENCOUNTER — Ambulatory Visit (INDEPENDENT_AMBULATORY_CARE_PROVIDER_SITE_OTHER): Payer: Self-pay | Admitting: Family Medicine

## 2017-12-04 VITALS — BP 129/86 | HR 81 | Temp 97.1°F | Ht 70.0 in | Wt 205.0 lb

## 2017-12-04 DIAGNOSIS — S76202A Unspecified injury of adductor muscle, fascia and tendon of left thigh, initial encounter: Secondary | ICD-10-CM

## 2017-12-04 MED ORDER — DICLOFENAC SODIUM 75 MG PO TBEC: 75.0000 mg | DELAYED_RELEASE_TABLET | Freq: Two times a day (BID) | ORAL | 0 refills | Status: DC

## 2017-12-04 NOTE — Progress Notes (Signed)
Chief Complaint  Patient presents with  . Groin Pain    2 months    HPI  Patient presents today for right groin pain. NKI.  Insidious onset. Present 2 mos. Recently had numbness. One episode 2 days ago of tingling for awhile.  PMH: Smoking status noted ROS: Per HPI  Objective: BP 129/86   Pulse 81   Temp (!) 97.1 F (36.2 C) (Oral)   Ht 5\' 10"  (1.778 m)   Wt 205 lb (93 kg)   BMI 29.41 kg/m  Gen: NAD, alert, cooperative with exam Ext: No edema, warm.  There is marked tenderness at the right thigh abductor origin tendon.  This is painful for resisted abduction.  Gait is normal. Neuro: Alert and oriented, No gross deficits  Assessment and plan:  1. Injury of adductor muscle and tendon of left thigh, initial encounter     Meds ordered this encounter  Medications  . diclofenac (VOLTAREN) 75 MG EC tablet    Sig: Take 1 tablet (75 mg total) by mouth 2 (two) times daily. BID x 2 weeks    Dispense:  30 tablet    Refill:  0    Therapeutic exercise is demonstrated for the patient  Follow up as needed.  Mechele Claude, MD

## 2017-12-04 NOTE — Patient Instructions (Signed)
"   Heat, exercise as discussed"

## 2018-01-06 ENCOUNTER — Ambulatory Visit (INDEPENDENT_AMBULATORY_CARE_PROVIDER_SITE_OTHER): Payer: Managed Care, Other (non HMO) | Admitting: Family Medicine

## 2018-01-06 ENCOUNTER — Encounter: Payer: Self-pay | Admitting: Family Medicine

## 2018-01-06 VITALS — BP 102/63 | HR 66 | Temp 97.7°F | Wt 209.0 lb

## 2018-01-06 DIAGNOSIS — M76891 Other specified enthesopathies of right lower limb, excluding foot: Secondary | ICD-10-CM | POA: Diagnosis not present

## 2018-01-06 MED ORDER — PREDNISONE 10 MG PO TABS: ORAL_TABLET | ORAL | 0 refills | Status: DC

## 2018-01-06 NOTE — Progress Notes (Signed)
Chief Complaint  Patient presents with  . groin discomfort    HPI  Patient presents today for ongoing pain in the right groin.  This was first seen about a month ago.  See note from that day.  Patient denies any reinjury.  However he has been working out.  The pain is intermittent.  Not associated with any particular activity at this time.  However, there is some increased sensitivity with abduction of the right thigh.  He is been doing the exercises like I pointed out to him.  He is been taking the diclofenac twice a day.  He is been trying to take it easy on the joint as well.  Unfortunately he has not noted any improvement significant of significance.  Patient tells me that when he sits down its like his boxers are bunched up and he is sitting on them in the area.  PMH: Smoking status noted ROS: Per HPI  Objective: BP 102/63 (BP Location: Left Arm)   Pulse 66   Temp 97.7 F (36.5 C)   Wt 209 lb (94.8 kg)   BMI 29.99 kg/m  Gen: NAD, alert, cooperative with exam Genitourinary: The prostate is in normal with regard to size shape and consistency.  The right abductor magnus tendon at its origin is moderately tender.  Comparison to palpation of the abductor magnus tendon on the left reveals marked increase in tenderness on the right.  There is painful resisted abduction. Ext: No edema, warm Neuro: Alert and oriented, No gross deficits  Assessment and plan:  1. Tendinitis of adductor muscle of right hip     No orders of the defined types were placed in this encounter.   No orders of the defined types were placed in this encounter.   Follow up as needed.  Mechele Claude, MD

## 2018-01-20 ENCOUNTER — Ambulatory Visit: Payer: Managed Care, Other (non HMO) | Attending: Family Medicine | Admitting: Physical Therapy

## 2018-01-20 ENCOUNTER — Other Ambulatory Visit: Payer: Self-pay

## 2018-01-20 ENCOUNTER — Encounter: Payer: Self-pay | Admitting: Physical Therapy

## 2018-01-20 DIAGNOSIS — M79651 Pain in right thigh: Secondary | ICD-10-CM | POA: Insufficient documentation

## 2018-01-20 NOTE — Therapy (Signed)
[facility] [address] [place], [place], [address] Phone: [phone]   Fax:  [phone]  Physical Therapy Evaluation  Patient Details  Name: Alex Pettyravis Joseph MRN: [id number] Date of Birth: [birth year] Referring Provider (PT): Mechele ClaudeWarren Stacks MD.   Encounter Date: 01/20/2018  PT End of Session - 01/20/18 1104    Visit Number  1    Number of Visits  8    Date for PT Re-Evaluation  03/17/18    PT Start Time  1030    PT Stop Time  1102    PT Time Calculation (min)  32 min       History reviewed. No pertinent past medical history.  History reviewed. No pertinent surgical history.  There were no vitals filed for this visit.   Subjective Assessment - 01/20/18 1108    Subjective  The patient presents to the clinic with c/o right thigh pain that has been ongoing for several months.  He feels as if it may have coming from working out but he his not certain.  The pain comes and goes and her really in unable to pinpoint what reporduces the pain.  He has not found really anything that decreases his pain.    Patient Stated Goals  Get out of pain.    Currently in Pain?  Yes    Pain Score  4     Pain Location  Groin    Pain Orientation  Right    Pain Descriptors / Indicators  Aching    Pain Type  Acute pain    Pain Onset  More than a month ago    Pain Frequency  Intermittent    Aggravating Factors   See above.    Pain Relieving Factors  See above.         [facility] PT Assessment - 01/20/18 0001      Assessment   Medical Diagnosis  Tendinitis of adductor muscle of right hip.    Referring Provider (PT)  Mechele ClaudeWarren Stacks MD.    Onset Date/Surgical Date  --   09/2017.     Precautions   Precautions  None      Restrictions   Weight Bearing Restrictions  No      Balance Screen   Has the patient fallen in the past 6 months  No    Has the patient had a decrease in activity level because of a fear of falling?   No    Is the patient reluctant to  leave their home because of a fear of falling?   No      Prior Function   Level of Independence  Independent      ROM / Strength   AROM / PROM / Strength  AROM;Strength      AROM   Overall AROM Comments  Normal right LE ROM.      Strength   Overall Strength Comments  No right LE strength deficits noted.      Palpation   Palpation comment  Tender to palpation over right proximal to mid adductor muscle group.      Special Tests   Other special tests  Normal LE DTR's.      Ambulation/Gait   Gait Comments  WNL.                Objective measurements completed on examination: See above findings.      [facility] Adult [facility] Treatment/Exercise - 01/20/18 0001      Modalities   Modalities  Geologist, engineering Location  Right adductor region.    Electrical Stimulation Action  Pre-mod.    Electrical Stimulation Parameters  80-150 Hz x 10 minutes.    Electrical Stimulation Goals  Pain                  PT Long Term Goals - 01/20/18 1209      PT LONG TERM GOAL #1   Title  Independent with an HEP.    Time  6    Period  Weeks    Status  New      PT LONG TERM GOAL #2   Title  Perform ADL's with pain not > 2/10.    Time  6    Period  Weeks    Status  New             Plan - 01/20/18 1122    Clinical Impression Statement  The patient presents to OPPT with c/o right groin pain.  He c/o palpable tenderness over the proximal to mid-portion of his right adductor muscle group.  LE DTR's are intact and he has no LE strength deficits. Patient will benefit from skilled physical therapy intervention to address deficits.     Clinical Presentation  Evolving    Clinical Presentation due to:  Not improving.    Clinical Decision Making  Low    Rehab Potential  Excellent    PT Frequency  2x / week    PT Duration  4 weeks    PT Treatment/Interventions  ADLs/Self Care Home Management;Cryotherapy;Electrical  Stimulation;Moist Heat;Ultrasound;Therapeutic activities;Therapeutic exercise;Patient/family education;Manual techniques;Dry needling;Vasopneumatic Device    PT Next Visit Plan  Combo e'stim/U/S; STW/M; HMP and e'stim; adductor squeezes if pain-free.    Consulted and Agree with Plan of Care  Patient       Patient will benefit from skilled therapeutic intervention in order to improve the following deficits and impairments:  Pain  Visit Diagnosis: Pain in right thigh - Plan: PT plan of care cert/re-cert     Problem List There are no active problems to display for this patient.   Mitsuye Schrodt, [place] MPT 01/20/2018, 12:11 PM  [facility] [address] [place], [place], [address] Phone: [phone]   Fax:  [phone]  Name: Alex Joseph MRN: [id number] Date of Birth: 1990-04-14

## 2018-01-27 ENCOUNTER — Ambulatory Visit: Payer: Managed Care, Other (non HMO) | Admitting: Physical Therapy

## 2018-01-27 ENCOUNTER — Encounter: Payer: Self-pay | Admitting: Physical Therapy

## 2018-01-27 DIAGNOSIS — M79651 Pain in right thigh: Secondary | ICD-10-CM

## 2018-01-27 NOTE — Therapy (Signed)
[facility] [address] [place], [place], [address] Phone: [phone]   Fax:  [phone]  Physical Therapy Treatment  Patient Details  Name: Alex Joseph MRN: [id number] Date of Birth: 1990/06/14 Referring Provider (PT): Mechele ClaudeWarren Stacks MD.   Encounter Date: 01/27/2018  PT End of Session - 01/27/18 1211    Visit Number  2    Number of Visits  8    Date for PT Re-Evaluation  03/17/18    PT Start Time  1033    PT Stop Time  1108    PT Time Calculation (min)  35 min    Activity Tolerance  Patient tolerated treatment well    Behavior During Therapy  [facility] for tasks assessed/performed       History reviewed. No pertinent past medical history.  History reviewed. No pertinent surgical history.  There were no vitals filed for this visit.  Subjective Assessment - 01/27/18 1211    Subjective  That treatment gave me some relief.    Patient Stated Goals  Get out of pain.    Pain Score  3     Pain Location  Groin    Pain Orientation  Right    Pain Descriptors / Indicators  Aching    Pain Type  Acute pain    Pain Onset  More than a month ago                       [facility] Treatment/Exercise - 01/27/18 0001      Modalities   Modalities  Electrical Stimulation;Ultrasound      Electrical Stimulation   Electrical Stimulation Location  Right adductor.    Electrical Stimulation Action  Pre-mod.    Electrical Stimulation Parameters  80-150 Hz x 15 minutes.    Electrical Stimulation Goals  Pain      Ultrasound   Ultrasound Location  Right adductor.    Ultrasound Parameters  Combo e'stim/U/S at 1.50 W/CM2 x 12 minutes.    Ultrasound Goals  Pain                  PT Long Term Goals - 01/20/18 1209      PT LONG TERM GOAL #1   Title  Independent with an HEP.    Time  6    Period  Weeks    Status  New      PT LONG TERM GOAL #2   Title  Perform ADL's with pain not > 2/10.    Time  6    Period  Weeks     Status  New            Plan - 01/27/18 1213    Clinical Impression Statement  Patient doing well with treatments thus far and is feeling better.    PT Frequency  2x / week    PT Duration  4 weeks    PT Treatment/Interventions  ADLs/Self Care Home Management;Cryotherapy;Electrical Stimulation;Moist Heat;Ultrasound;Therapeutic activities;Therapeutic exercise;Patient/family education;Manual techniques;Dry needling;Vasopneumatic Device    PT Next Visit Plan  Combo e'stim/U/S; STW/M; HMP and e'stim; adductor squeezes if pain-free.    Consulted and Agree with Plan of Care  Patient       Patient will benefit from skilled therapeutic intervention in order to improve the following deficits and impairments:  Pain  Visit Diagnosis: Pain in right thigh     Problem List There are no active problems to display for this patient.   Jeffie Widdowson, [place]  MPT 01/27/2018, 12:15 PM  [facility] [address] [place], [place], [address] Phone: [phone]   Fax:  [phone]  Name: Lester Crickenberger MRN: [id number] Date of Birth: [birth year]

## 2018-01-29 ENCOUNTER — Telehealth: Payer: Self-pay | Admitting: *Deleted

## 2018-02-03 ENCOUNTER — Encounter: Payer: Self-pay | Admitting: Physical Therapy

## 2018-02-03 ENCOUNTER — Ambulatory Visit: Payer: Managed Care, Other (non HMO) | Attending: Family Medicine | Admitting: Physical Therapy

## 2018-02-03 DIAGNOSIS — M79651 Pain in right thigh: Secondary | ICD-10-CM | POA: Insufficient documentation

## 2018-02-03 NOTE — Therapy (Addendum)
[facility] [place], [place], [address] Phone: [phone]   Fax:  [phone]  Physical Therapy Treatment  Patient Details  Name: Alex Joseph MRN: [id number] Date of Birth: [birth year] Referring Provider (PT): Claretta Fraise MD.   Encounter Date: 02/03/2018  PT End of Session - 02/03/18 1113    Visit Number  3    Number of Visits  8    Date for PT Re-Evaluation  03/17/18    PT Start Time  1030    PT Stop Time  1105    PT Time Calculation (min)  35 min    Activity Tolerance  Patient tolerated treatment well    Behavior During Therapy  [facility] for tasks assessed/performed       History reviewed. No pertinent past medical history.  History reviewed. No pertinent surgical history.  There were no vitals filed for this visit.  Subjective Assessment - 02/03/18 1123    Subjective  Sometimes I feel like the treatments are helping.    Patient Stated Goals  Get out of pain.    Currently in Pain?  Yes    Pain Score  3     Pain Location  Groin    Pain Orientation  Right    Pain Descriptors / Indicators  Aching    Pain Onset  More than a month ago                       [facility] Adult PT Treatment/Exercise - 02/03/18 0001      Modalities   Modalities  Electrical Stimulation;Moist Heat;Ultrasound      Moist Heat Therapy   Number Minutes Moist Heat  20 Minutes    Moist Heat Location  --   Right groin.     Acupuncturist Location  Right adductor.    Electrical Stimulation Action  Pre-mod.    Electrical Stimulation Parameters  80-150 Hz x 20 minutes.    Electrical Stimulation Goals  Pain      Ultrasound   Ultrasound Location  Right adductor    Ultrasound Parameters  Combo e'stim/U/S at 1.50 W/CM2 x 10 minutes.    Ultrasound Goals  Pain                  PT Long Term Goals - 01/20/18 1209      PT LONG TERM GOAL #1   Title  Independent with an HEP.    Time  6     Period  Weeks    Status  New      PT LONG TERM GOAL #2   Title  Perform ADL's with pain not > 2/10.    Time  6    Period  Weeks    Status  New            Plan - 02/03/18 1159    Clinical Impression Statement  Patient continues to have off and on groin pain.      Rehab Potential  Excellent    PT Frequency  2x / week    PT Duration  4 weeks    PT Treatment/Interventions  ADLs/Self Care Home Management;Cryotherapy;Electrical Stimulation;Moist Heat;Ultrasound;Therapeutic activities;Therapeutic exercise;Patient/family education;Manual techniques;Dry needling;Vasopneumatic Device    PT Next Visit Plan  Combo e'stim/U/S; STW/M; HMP and e'stim; adductor squeezes if pain-free.    Consulted and Agree with Plan of Care  Patient       Patient will benefit from skilled therapeutic intervention  in order to improve the following deficits and impairments:  Pain  Visit Diagnosis: Pain in right thigh     Problem List There are no active problems to display for this patient.   Satori Krabill, [place] MPT 02/03/2018, 12:00 PM  [facility] [place], [place], [address] Phone: [phone]   Fax:  [phone]  Name: Alex Joseph MRN: [id number] Date of Birth: Mar 05, 1991  PHYSICAL THERAPY DISCHARGE SUMMARY  Visits from Start of Care: 3.  Current functional level related to goals / functional outcomes: See above.   Remaining deficits: See above.   Education / Equipment:  Plan: Patient agrees to discharge.  Patient goals were not met. Patient is being discharged due to not returning since the last visit.  ?????         [place] Rilie Glanz MPT

## 2018-02-10 ENCOUNTER — Ambulatory Visit (INDEPENDENT_AMBULATORY_CARE_PROVIDER_SITE_OTHER): Payer: Managed Care, Other (non HMO) | Admitting: Family

## 2018-02-10 ENCOUNTER — Ambulatory Visit: Payer: Managed Care, Other (non HMO) | Admitting: Physical Therapy

## 2018-02-10 ENCOUNTER — Encounter: Payer: Self-pay | Admitting: Family

## 2018-02-10 VITALS — BP 134/86 | HR 89 | Temp 98.1°F | Ht 70.0 in | Wt 209.8 lb

## 2018-02-10 DIAGNOSIS — R1031 Right lower quadrant pain: Secondary | ICD-10-CM | POA: Diagnosis not present

## 2018-02-10 DIAGNOSIS — M76891 Other specified enthesopathies of right lower limb, excluding foot: Secondary | ICD-10-CM | POA: Diagnosis not present

## 2018-02-10 MED ORDER — DICLOFENAC SODIUM 75 MG PO TBEC: 75.0000 mg | DELAYED_RELEASE_TABLET | Freq: Two times a day (BID) | ORAL | 0 refills | Status: DC

## 2018-02-10 MED ORDER — PREDNISONE 10 MG (21) PO TBPK: ORAL_TABLET | ORAL | 0 refills | Status: DC

## 2018-02-10 NOTE — Patient Instructions (Signed)
Tendinitis Tendinitis is inflammation of a tendon. A tendon is a strong cord of tissue that connects muscle to bone. Tendinitis can affect any tendon, but it most commonly affects the shoulder tendon (rotator cuff), ankle tendon (Achilles tendon), elbow tendon (triceps tendon), or one of the tendons in the wrist. What are the causes? This condition may be caused by:  Overusing a tendon or muscle. This is common.  Age-related wear and tear.  Injury.  Inflammatory conditions, such as arthritis.  Certain medicines.  What increases the risk? This condition is more likely to develop in people who do activities that involve repetitive motions. What are the signs or symptoms? Symptoms of this condition may include:  Pain.  Tenderness.  Mild swelling.  How is this diagnosed? This condition is diagnosed with a physical exam. You may also have tests, such as:  Ultrasound. This uses sound waves to make an image of your affected area.  MRI.  How is this treated? This condition may be treated by resting, icing, applying pressure (compression), and raising (elevating) the area above the level of your heart. This is known as RICE therapy. Treatment may also include:  Medicines to help reduce inflammation or to help reduce pain.  Exercises or physical therapy to strengthen and stretch the tendon.  A brace or splint.  Surgery (rare).  Follow these instructions at home:  If you have a splint or brace:  Wear the splint or brace as told by your health care provider. Remove it only as told by your health care provider.  Loosen the splint or brace if your fingers or toes tingle, become numb, or turn cold and blue.  Do not take baths, swim, or use a hot tub until your health care provider approves. Ask your health care provider if you can take showers. You may only be allowed to take sponge baths for bathing.  Do not let your splint or brace get wet if it is not waterproof. ? If your  splint or brace is not waterproof, cover it with a watertight plastic bag when you take a bath or a shower.  Keep the splint or brace clean. Managing pain, stiffness, and swelling  If directed, apply ice to the affected area. ? Put ice in a plastic bag. ? Place a towel between your skin and the bag. ? Leave the ice on for 20 minutes, 2-3 times a day.  If directed, apply heat to the affected area as often as told by your health care provider. Use the heat source that your health care provider recommends, such as a moist heat pack or a heating pad. ? Place a towel between your skin and the heat source. ? Leave the heat on for 20-30 minutes. ? Remove the heat if your skin turns bright red. This is especially important if you are unable to feel pain, heat, or cold. You may have a greater risk of getting burned.  Move the fingers or toes of the affected limb often, if this applies. This can help to prevent stiffness and lessen swelling.  If directed, elevate the affected area above the level of your heart while you are sitting or lying down. Driving  Do not drive or operate heavy machinery while taking prescription pain medicine.  Ask your health care provider when it is safe to drive if you have a splint or brace on any part of your arm or leg. Activity  Return to your normal activities as told by your health care   provider. Ask your health care provider what activities are safe for you.  Rest the affected area as told by your health care provider.  Avoid using the affected area while you are experiencing symptoms of tendinitis.  Do exercises as told by your health care provider. General instructions  If you have a splint, do not put pressure on any part of the splint until it is fully hardened. This may take several hours.  Wear an elastic bandage or compression wrap only as told by your health care provider.  Take over-the-counter and prescription medicines only as told by your  health care provider.  Keep all follow-up visits as told by your health care provider. This is important. Contact a health care provider if:  Your symptoms do not improve.  You develop new, unexplained problems, such as numbness in your hands. This information is not intended to replace advice given to you by your health care provider. Make sure you discuss any questions you have with your health care provider. Document Released: 02/17/2000 Document Revised: 10/20/2015 Document Reviewed: 11/22/2014 Elsevier Interactive Patient Education  2018 [employer]  

## 2018-02-10 NOTE — Progress Notes (Signed)
Subjective:    Patient ID: Alex Joseph, male    DOB: [birth year], 27 y.o.   MRN: [id number]  Chief Complaint  Patient presents with  . Groin Pain   Pt presents to the office today with recurrent right groin pain. He has been seen in our office on 09/27/17  and recommended to have testing to rule out SI. He did not have insurance at that time.   He was then seen on 12/04/17 and 01/06/18.  He was given diclofenac and referral to Physical Therapy for tendinitis of adductor muscle. Pt reports no improvement.    He states now he is having trouble getting and keeping an erection. Unsure if this is related to pain, but was worried the two was connected. Denies any testicular masses, pain, discharge, or hernias.  Groin Pain  The patient's pertinent negatives include no genital injury, genital itching, scrotal swelling or testicular pain.  Hip Pain   The incident occurred more than 1 week ago. There was no injury mechanism. The pain is present in the right heel and right hip (right groin). The quality of the pain is described as aching. The pain is at a severity of 4/10. The pain is mild. The pain has been intermittent since onset. Nothing aggravates the symptoms. Treatments tried: prednsione, PT. The treatment provided no relief.      Review of Systems  Genitourinary: Negative for scrotal swelling and testicular pain.  All other systems reviewed and are negative.      Objective:   Physical Exam  Constitutional: He is oriented to person, place, and time. He appears well-developed and well-nourished. No distress.  HENT:  Head: Normocephalic.  Eyes: Pupils are equal, round, and reactive to light. Right eye exhibits no discharge. Left eye exhibits no discharge.  Neck: Normal range of motion. Neck supple. No thyromegaly present.  Cardiovascular: Normal rate, regular rhythm, normal heart sounds and intact distal pulses.  No murmur heard. Pulmonary/Chest: Effort normal and breath sounds normal.  No respiratory distress. He has no wheezes.  Abdominal: Soft. Bowel sounds are normal. He exhibits no distension. There is no tenderness. Hernia confirmed negative in the right inguinal area and confirmed negative in the left inguinal area.  Genitourinary: Right testis shows no mass, no swelling and no tenderness. Right testis is descended. Cremasteric reflex is not absent on the right side. Left testis shows no mass, no swelling and no tenderness. Left testis is descended. Cremasteric reflex is not absent on the left side.  Musculoskeletal: Normal range of motion. He exhibits no edema or tenderness.  Full ROM of right hip, no pain in right groin with palpation.  Lymphadenopathy: No inguinal adenopathy noted on the right or left side.  Neurological: He is alert and oriented to person, place, and time. He has normal reflexes. No cranial nerve deficit.  Skin: Skin is warm and dry. No rash noted. No erythema.  Psychiatric: He has a normal mood and affect. His behavior is normal. Judgment and thought content normal.  Vitals reviewed.   BP 134/86   Pulse 89   Temp 98.1 F (36.7 C) (Oral)   Ht 5\' 10"  (1.778 m)   Wt 209 lb 12.8 oz (95.2 kg)   BMI 30.10 kg/m        Assessment & Plan:  Alex Joseph comes in today with chief complaint of Groin Pain   Diagnosis and orders addressed:  1. Tendinitis involving hip abductors, right - Chlamydia/Gonococcus/Trichomonas, NAA - STD Screen (8) - CBC with  Differential/Platelet - predniSONE (STERAPRED UNI-PAK 21 TAB) 10 MG (21) TBPK tablet; Use as directed  Dispense: 21 tablet; Refill: 0 - diclofenac (VOLTAREN) 75 MG EC tablet; Take 1 tablet (75 mg total) by mouth 2 (two) times daily.  Dispense: 30 tablet; Refill: 0  2. Right inguinal pain - Chlamydia/Gonococcus/Trichomonas, NAA - STD Screen (8) - CBC with Differential/Platelet   Will rule out SI From exam I believe this is tendinitis. Will try prednisone and diclofenac together. If not improved  may need [place]? Rest ROM exercises RTO if symptoms worsen or do not improve   Jannifer Rodney, FNP

## 2018-02-11 LAB — STD SCREEN (8)
HEP A IGM: NEGATIVE
HIV Screen 4th Generation wRfx: NONREACTIVE
HSV 1 Glycoprotein G Ab, IgG: 46.9 index — ABNORMAL HIGH (ref 0.00–0.90)
Hep B C IgM: NEGATIVE
Hep C Virus Ab: <0.1 s/co ratio (ref 0.0–0.9)
Hepatitis B Surface Ag: NEGATIVE
RPR Ser Ql: NONREACTIVE

## 2018-02-11 LAB — CBC WITH DIFFERENTIAL/PLATELET
BASOS ABS: 0.1 10*3/uL (ref 0.0–0.2)
Basos: 1 %
EOS (ABSOLUTE): 0.2 10*3/uL (ref 0.0–0.4)
Eos: 4 %
HEMOGLOBIN: 14.1 g/dL (ref 13.0–17.7)
Hematocrit: 41.2 % (ref 37.5–51.0)
IMMATURE GRANS (ABS): 0 10*3/uL (ref 0.0–0.1)
Immature Granulocytes: 0 %
LYMPHS: 33 %
Lymphocytes Absolute: 1.7 10*3/uL (ref 0.7–3.1)
MCH: 30.7 pg (ref 26.6–33.0)
MCHC: 34.2 g/dL (ref 31.5–35.7)
MCV: 90 fL (ref 79–97)
MONOCYTES: 8 %
Monocytes Absolute: 0.4 10*3/uL (ref 0.1–0.9)
NEUTROS PCT: 54 %
Neutrophils Absolute: 2.7 10*3/uL (ref 1.4–7.0)
Platelets: 416 10*3/uL (ref 150–450)
RBC: 4.59 x10E6/uL (ref 4.14–5.80)
RDW: 13 % (ref 12.3–15.4)
WBC: 5 10*3/uL (ref 3.4–10.8)

## 2018-02-13 LAB — CHLAMYDIA/GONOCOCCUS/TRICHOMONAS, NAA
Chlamydia by NAA: NEGATIVE
Gonococcus by NAA: NEGATIVE
Trich vag by NAA: NEGATIVE

## 2018-03-06 NOTE — Telephone Encounter (Signed)
lmtcb for flu shot 

## 2018-03-11 ENCOUNTER — Other Ambulatory Visit: Payer: Self-pay | Admitting: Family

## 2018-03-13 ENCOUNTER — Other Ambulatory Visit: Payer: Self-pay | Admitting: Family

## 2018-03-13 DIAGNOSIS — M76891 Other specified enthesopathies of right lower limb, excluding foot: Secondary | ICD-10-CM

## 2018-03-24 ENCOUNTER — Encounter (INDEPENDENT_AMBULATORY_CARE_PROVIDER_SITE_OTHER): Payer: Self-pay | Admitting: Physician Assistant

## 2018-03-24 ENCOUNTER — Ambulatory Visit (INDEPENDENT_AMBULATORY_CARE_PROVIDER_SITE_OTHER): Payer: Managed Care, Other (non HMO) | Admitting: Physician Assistant

## 2018-03-24 VITALS — Ht 70.0 in | Wt 209.8 lb

## 2018-03-24 DIAGNOSIS — R1031 Right lower quadrant pain: Secondary | ICD-10-CM

## 2018-03-24 NOTE — Progress Notes (Signed)
Office Visit Note   Patient: Alex Joseph           Date of Birth: 1990/04/06           MRN: [id number] Visit Date: 03/24/2018              Requested by: Alex Spencer, FNP [address] [place], [place] [address] PCP: Alex Claude, MD   Assessment & Plan: Visit Diagnoses:  1. Groin pain, right     Plan: We will refer him to urology for his right groin pain and erectile dysfunction.  I explained to him did not feel that this was orthopedic in nature due to the fact that his clinical exam shows excellent range of motion of his right hip without pain and no radicular symptoms are down the right leg or back pain.  Follow-Up Instructions: Return if symptoms worsen or fail to improve.   Orders:  Orders Placed This Encounter  Procedures  . Ambulatory referral to Urology   No orders of the defined types were placed in this encounter.     Procedures: No procedures performed   Clinical Data: No additional findings.   Subjective: Chief Complaint  Patient presents with  . Right Thigh - Pain    Patient c/o of right side groin area in pain    HPI Alex Joseph is a 28 year old male comes in today with right groin pain for 2 to 3 months.  No known injury.  Seen by his primary care physician who gave him Voltaren gel and prednisone which did not relieve the pain in his groin.  He denies any increase in pain with activity.  Denies any fevers chills.  He is having no waking pain.  No radicular pain down the leg no back pain.  He does note some erectile dysfunction over the last 2 months.  He denies any increased frequency in urination or dysuria. Review of Systems Denies fevers chills.  Please see HPI otherwise negative  Objective: Vital Signs: Ht 5\' 10"  (1.778 m)   Wt 209 lb 12.8 oz (95.2 kg)   BMI 30.10 kg/m   Physical Exam Constitutional:      Appearance: He is not ill-appearing or diaphoretic.  Pulmonary:     Effort: Pulmonary effort is normal.  Neurological:   Mental Status: He is alert and oriented to person, place, and time.  Psychiatric:        Mood and Affect: Mood normal.     Ortho Exam Bilateral hips excellent range of motion without pain.  Faber's is negative bilaterally.  He is nontender over the SI joints bilaterally.  5 out of 5 strength throughout the lower extremities against resistance.  Negative straight leg raise bilaterally. Specialty Comments:  No specialty comments available.  Imaging: No results found.   PMFS History: There are no active problems to display for this patient.  No past medical history on file.  No family history on file.  No past surgical history on file. Social History   Occupational History  . Not on file  Tobacco Use  . Smoking status: Former Smoker    Packs/day: 0.25    Types: Cigarettes    Start date: 05/07/2011    Last attempt to quit: 07/04/2014    Years since quitting: 3.7  . Smokeless tobacco: Former Engineer, water and Sexual Activity  . Alcohol use: Yes    Alcohol/week: 15.0 standard drinks    Types: 15 Standard drinks or equivalent per week  . Drug  use: No  . Sexual activity: Not on file

## 2018-03-26 ENCOUNTER — Ambulatory Visit (INDEPENDENT_AMBULATORY_CARE_PROVIDER_SITE_OTHER): Payer: Managed Care, Other (non HMO) | Admitting: Family Medicine

## 2018-03-26 ENCOUNTER — Encounter: Payer: Self-pay | Admitting: Family Medicine

## 2018-03-26 VITALS — BP 93/71 | HR 96 | Temp 102.3°F | Ht 70.0 in | Wt 203.0 lb

## 2018-03-26 DIAGNOSIS — J101 Influenza due to other identified influenza virus with other respiratory manifestations: Secondary | ICD-10-CM

## 2018-03-26 DIAGNOSIS — R6889 Other general symptoms and signs: Secondary | ICD-10-CM | POA: Diagnosis not present

## 2018-03-26 DIAGNOSIS — R509 Fever, unspecified: Secondary | ICD-10-CM

## 2018-03-26 LAB — VERITOR FLU A/B WAIVED
Influenza A: POSITIVE — AB
Influenza B: NEGATIVE

## 2018-03-26 MED ORDER — OSELTAMIVIR PHOSPHATE 75 MG PO CAPS: 75.0000 mg | ORAL_CAPSULE | Freq: Two times a day (BID) | ORAL | 0 refills | Status: AC

## 2018-03-26 MED ORDER — ACETAMINOPHEN 500 MG PO TABS
1000.0000 mg | ORAL_TABLET | Freq: Once | ORAL | Status: AC
  Administered 2018-03-26: 1000 mg via ORAL

## 2018-03-26 NOTE — Patient Instructions (Signed)

## 2018-03-26 NOTE — Progress Notes (Signed)
    Subjective:     Alex Joseph is a 28 y.o. male who presents for evaluation of symptoms of a URI and myalgias. Symptoms include achiness, congestion, cough described as nonproductive, fever 102, headache described as aching, nasal congestion, sore throat and malaise, fatigue. Onset of symptoms was 1 day ago, and has been gradually worsening since that time. Treatment to date: none.  The following portions of the patient's history were reviewed and updated as appropriate: allergies, current medications, past family history, past medical history, past social history, past surgical history and problem list.  Review of Systems Constitutional: positive for chills, fatigue, fevers and malaise Eyes: negative Ears, nose, mouth, throat, and face: positive for nasal congestion and sore throat Respiratory: positive for cough Cardiovascular: negative Musculoskeletal:positive for myalgias Neurological: positive for headaches   Objective:    BP 93/71   Pulse 96   Temp (!) 102.3 F (39.1 C) (Oral)   Ht 5\' 10"  (1.778 m)   Wt 203 lb (92.1 kg)   SpO2 98%   BMI 29.13 kg/m  General appearance: alert, cooperative and moderate distress Head: Normocephalic, without obvious abnormality, atraumatic Eyes: negative Ears: normal TM's and external ear canals both ears Nose: mild congestion, turbinates red, swollen, no sinus tenderness Throat: abnormal findings: mild oropharyngeal erythema Neck: no adenopathy, no carotid bruit, no JVD, supple, symmetrical, trachea midline and thyroid not enlarged, symmetric, no tenderness/mass/nodules Lungs: clear to auscultation bilaterally Heart: regular rate and rhythm, S1, S2 normal, no murmur, click, rub or gallop Skin: Skin color, texture, turgor normal. No rashes or lesions Lymph nodes: Cervical, supraclavicular, and axillary nodes normal. Neurologic: Grossly normal   Assessment:   [name] was seen today for body aches, chest congestion, cough,  headache.  Diagnoses and all orders for this visit:  Flu-like symptoms -     Veritor Flu A/B Waived  Fever and chills Alternate tylenol and motrin. Adequate hydration.  -     acetaminophen (TYLENOL) tablet 1,000 mg  Influenza A Symptomatic care discussed. Adequate rest and hydration. Medications as prescribed.  -     oseltamivir (TAMIFLU) 75 MG capsule; Take 1 capsule (75 mg total) by mouth 2 (two) times daily for 5 days.    Plan:    Suggested symptomatic OTC remedies. Follow up as needed.    The above assessment and management plan was discussed with the patient. The patient verbalized understanding of and has agreed to the management plan. Patient is aware to call the clinic if symptoms fail to improve or worsen. Patient is aware when to return to the clinic for a follow-up visit. Patient educated on when it is appropriate to go to the emergency department.   Kari Baars, FNP-C [facility] [address] [place], [place] [address] [phone]

## 2019-02-17 ENCOUNTER — Encounter: Payer: Self-pay | Admitting: Family Medicine

## 2019-02-17 ENCOUNTER — Ambulatory Visit (INDEPENDENT_AMBULATORY_CARE_PROVIDER_SITE_OTHER): Payer: Managed Care, Other (non HMO) | Admitting: Family Medicine

## 2019-02-17 DIAGNOSIS — R32 Unspecified urinary incontinence: Secondary | ICD-10-CM

## 2019-02-17 LAB — MICROSCOPIC EXAMINATION
Bacteria, UA: NONE SEEN
Epithelial Cells (non renal): NONE SEEN /hpf (ref 0–10)
RBC, Urine: NONE SEEN /hpf (ref 0–2)
Renal Epithel, UA: NONE SEEN /hpf
WBC, UA: NONE SEEN /hpf (ref 0–5)

## 2019-02-17 LAB — URINALYSIS, COMPLETE
Bilirubin, UA: NEGATIVE
Glucose, UA: NEGATIVE
Ketones, UA: NEGATIVE
Leukocytes,UA: NEGATIVE
Nitrite, UA: NEGATIVE
Protein,UA: NEGATIVE
RBC, UA: NEGATIVE
Specific Gravity, UA: 1.015 (ref 1.005–1.030)
Urobilinogen, Ur: 0.2 mg/dL (ref 0.2–1.0)
pH, UA: 7.5 (ref 5.0–7.5)

## 2019-02-17 NOTE — Progress Notes (Signed)
   Virtual Visit via Telephone Note  I connected with Alex Joseph on 02/17/19 at 3:06 PM by telephone and verified that I am speaking with the correct person using two identifiers. Alex Joseph is currently located at work and nobody is currently with him during this visit. The provider, Loman Brooklyn, FNP is located in their office at time of visit.  I discussed the limitations, risks, security and privacy concerns of performing an evaluation and management service by telephone and the availability of in person appointments. I also discussed with the patient that there may be a patient responsible charge related to this service. The patient expressed understanding and agreed to proceed.  Subjective: PCP: Claretta Fraise, MD  Chief Complaint  Patient presents with  . Urinary Tract Infection   Urinary Tract Infection: Patient reports once last week his underwear was wet like he was leaking urine, then it started again two days ago. He does sometimes feel when he needs to urinate that he must go right away. Patient does not feel this could be a STI as he has been with the same person for the past year. He states based on assessment of his underwear, he feels it is urine.   ROS: Per HPI No current outpatient medications on file.  Allergies  Allergen Reactions  . Cefzil [Cefprozil]    History reviewed. No pertinent past medical history.  Observations/Objective: A&O  No respiratory distress or wheezing audible over the phone Mood, judgement, and thought processes all WNL  Assessment and Plan: 1. Urinary incontinence, unspecified type - Discussed with patient that his symptoms are not typical for a UTI and that I would like to test his urine before starting him on antibiotics. Offered STI screening but he declined. He will come either this afternoon or Thursday to leave a urine specimen.  - urinalysis- dip and micro   Follow Up Instructions:  I discussed the assessment and treatment  plan with the patient. The patient was provided an opportunity to ask questions and all were answered. The patient agreed with the plan and demonstrated an understanding of the instructions.   The patient was advised to call back or seek an in-person evaluation if the symptoms worsen or if the condition fails to improve as anticipated.  The above assessment and management plan was discussed with the patient. The patient verbalized understanding of and has agreed to the management plan. Patient is aware to call the clinic if symptoms persist or worsen. Patient is aware when to return to the clinic for a follow-up visit. Patient educated on when it is appropriate to go to the emergency department.   Time call ended: 3:12 PM  I provided 11 minutes of non-face-to-face time during this encounter.  Hendricks Limes, MSN, APRN, FNP-C [facility] 02/17/19

## 2019-02-19 ENCOUNTER — Telehealth: Payer: Self-pay | Admitting: Family Medicine

## 2019-02-19 DIAGNOSIS — R32 Unspecified urinary incontinence: Secondary | ICD-10-CM

## 2019-02-19 NOTE — Telephone Encounter (Signed)
Patient called and said that he saw Hendricks Limes on 02/17/19 and saw that she had made a comment on his My Chart about wanting to refer him to a Urologist. Patient said he is ok with it.

## 2019-02-20 NOTE — Telephone Encounter (Signed)
Can this referral be placed?

## 2019-02-23 NOTE — Telephone Encounter (Signed)
Referral placed.

## 2019-12-11 ENCOUNTER — Other Ambulatory Visit: Payer: Self-pay

## 2019-12-11 ENCOUNTER — Other Ambulatory Visit: Payer: Managed Care, Other (non HMO)

## 2020-03-11 ENCOUNTER — Ambulatory Visit: Payer: Managed Care, Other (non HMO) | Admitting: Family Medicine

## 2020-03-17 ENCOUNTER — Ambulatory Visit (INDEPENDENT_AMBULATORY_CARE_PROVIDER_SITE_OTHER): Payer: Managed Care, Other (non HMO) | Admitting: Family Medicine

## 2020-03-17 ENCOUNTER — Other Ambulatory Visit: Payer: Self-pay

## 2020-03-17 ENCOUNTER — Encounter: Payer: Self-pay | Admitting: Family Medicine

## 2020-03-17 VITALS — BP 132/84 | HR 87 | Temp 98.8°F | Ht 70.0 in | Wt 197.2 lb

## 2020-03-17 DIAGNOSIS — F419 Anxiety disorder, unspecified: Secondary | ICD-10-CM | POA: Diagnosis not present

## 2020-03-17 DIAGNOSIS — F321 Major depressive disorder, single episode, moderate: Secondary | ICD-10-CM | POA: Diagnosis not present

## 2020-03-17 MED ORDER — CITALOPRAM HYDROBROMIDE 20 MG PO TABS: 20.0000 mg | ORAL_TABLET | Freq: Every day | ORAL | 5 refills | Status: DC

## 2020-03-17 NOTE — Progress Notes (Signed)
Established Patient Office Visit  Subjective:  Patient ID: Alex Joseph, male    DOB: [birth year]  Age: 30 y.o. MRN: [id number]  CC:  Chief Complaint  Patient presents with  . Anxiety    HPI Alex Joseph presents for anxiety.  1. Anxiety Alex Joseph reports anxiety over the last 2 month or so. He did have Covid during this time and has been going through a lot of life changes with buying and renovating a house and living with family while doing so. He is also currenlty working 3rd shift. This is a new problem for him and he denies anxiety or depression in the past. He is not having panic attacks. He has not tried any remedies. He is not currently interested in counseling.   GAD 7 : Generalized Anxiety Score 03/17/2020  Nervous, Anxious, on Edge 1  Control/stop worrying 2  Worry too much - different things 1  Trouble relaxing 1  Restless 0  Easily annoyed or irritable 1  Afraid - awful might happen 0  Total GAD 7 Score 6  Anxiety Difficulty Somewhat difficult    Depression screen [facility] 2/9 03/17/2020 03/26/2018 02/10/2018  Decreased Interest 3 0 0  Down, Depressed, Hopeless 2 0 0  PHQ - 2 Score 5 0 0  Altered sleeping 1 - -  Tired, decreased energy 1 - -  Change in appetite 1 - -  Feeling bad or failure about yourself  0 - -  Trouble concentrating 1 - -  Moving slowly or fidgety/restless 0 - -  Suicidal thoughts 0 - -  PHQ-9 Score 9 - -  Difficult doing work/chores Very difficult - -     No past medical history on file.  No past surgical history on file.  No family history on file.  Social History   Socioeconomic History  . Marital status: Single    Spouse name: Not on file  . Number of children: Not on file  . Years of education: Not on file  . Highest education level: Not on file  Occupational History  . Not on file  Tobacco Use  . Smoking status: Former Smoker    Packs/day: 0.25    Types: Cigarettes    Start date: 05/07/2011    Quit date: 07/04/2014    Years since  quitting: 5.7  . Smokeless tobacco: Former Clinical biochemist  . Vaping Use: Never used  Substance and Sexual Activity  . Alcohol use: Yes    Alcohol/week: 15.0 standard drinks    Types: 15 Standard drinks or equivalent per week  . Drug use: No  . Sexual activity: Not on file  Other Topics Concern  . Not on file  Social History Narrative  . Not on file   Social Determinants of Health   Financial Resource Strain: Not on file  Food Insecurity: Not on file  Transportation Needs: Not on file  Physical Activity: Not on file  Stress: Not on file  Social Connections: Not on file  Intimate Partner Violence: Not on file    No outpatient medications prior to visit.   No facility-administered medications prior to visit.    Allergies  Allergen Reactions  . Cefzil [Cefprozil]     ROS Review of Systems Negative unless specially indicated above in HPI.    Objective:    Physical Exam Vitals and nursing note reviewed.  Constitutional:      General: He is not in acute distress.    Appearance: Normal appearance. He is  not ill-appearing, toxic-appearing or diaphoretic.  Eyes:     Extraocular Movements: Extraocular movements intact.     Conjunctiva/sclera: Conjunctivae normal.  Cardiovascular:     Rate and Rhythm: Normal rate and regular rhythm.     Heart sounds: Normal heart sounds. No murmur heard.   Pulmonary:     Effort: Pulmonary effort is normal. No respiratory distress.     Breath sounds: Normal breath sounds.  Musculoskeletal:     Right lower leg: No edema.     Left lower leg: No edema.  Skin:    General: Skin is dry.  Neurological:     General: No focal deficit present.     Mental Status: He is alert and oriented to person, place, and time.  Psychiatric:        Mood and Affect: Mood normal.        Behavior: Behavior normal.        Thought Content: Thought content normal.     BP 132/84   Pulse 87   Temp 98.8 F (37.1 C) (Temporal)   Ht 5\' 10"  (1.778 m)    Wt 197 lb 4 oz (89.5 kg)   BMI 28.30 kg/m  Wt Readings from Last 3 Encounters:  03/17/20 197 lb 4 oz (89.5 kg)  03/26/18 203 lb (92.1 kg)  03/24/18 209 lb 12.8 oz (95.2 kg)     Health Maintenance Due  Topic Date Due  . TETANUS/TDAP  02/13/2017    There are no preventive care reminders to display for this patient.  No results found for: TSH Lab Results  Component Value Date   WBC 5.0 02/10/2018   HGB 14.1 02/10/2018   HCT 41.2 02/10/2018   MCV 90 02/10/2018   PLT 416 02/10/2018   Lab Results  Component Value Date   NA 137 10/07/2015   K 4.1 10/07/2015   CO2 25 10/07/2015   GLUCOSE 107 (H) 10/07/2015   BUN 18 10/07/2015   CREATININE 1.03 10/07/2015   BILITOT 0.4 10/07/2015   ALKPHOS 64 10/07/2015   AST 16 10/07/2015   ALT 13 10/07/2015   PROT 7.4 10/07/2015   ALBUMIN 5.0 10/07/2015   CALCIUM 9.9 10/07/2015      Assessment & Plan:   Garmon was seen today for anxiety.  Diagnoses and all orders for this visit:  Depression, major, single episode, moderate (HCC) PHQ score of 9 today. Start Celexa. Declined counseling today. Denies SI. Follow up in 4 weeks, sooner if needed.  -     citalopram (CELEXA) 20 MG tablet; Take 1 tablet (20 mg total) by mouth daily.  Anxiety GAD score of 6 today. Start Celexa. Follow up in 4 weeks, sooner if needed.   -     citalopram (CELEXA) 20 MG tablet; Take 1 tablet (20 mg total) by mouth daily.    Follow-up: Return in about 4 weeks (around 04/14/2020) for anxiety.   The patient indicates understanding of these issues and agrees with the plan.  06/12/2020, FNP

## 2020-03-17 NOTE — Patient Instructions (Signed)
http://APA.org/depression-guideline"> https://clinicalkey.com"> http://point-of-care.elsevierperformancemanager.com/skills/"> http://point-of-care.elsevierperformancemanager.com">  Managing Depression, Adult Depression is a mental health condition that affects your thoughts, feelings, and actions. Being diagnosed with depression can bring you relief if you did not know why you have felt or behaved a certain way. It could also leave you feeling overwhelmed with uncertainty about your future. Preparing yourself to manage your symptoms can help you feel more positive about your future. How to manage lifestyle changes Managing stress Stress is your body's reaction to life changes and events, both good and bad. Stress can add to your feelings of depression. Learning to manage your stress can help lessen your feelings of depression. Try some of the following approaches to reducing your stress (stress reduction techniques):  Listen to music that you enjoy and that inspires you.  Try using a meditation app or take a meditation class.  Develop a practice that helps you connect with your spiritual self. Walk in nature, pray, or go to a place of worship.  Do some deep breathing. To do this, inhale slowly through your nose. Pause at the top of your inhale for a few seconds and then exhale slowly, letting your muscles relax.  Practice yoga to help relax and work your muscles. Choose a stress reduction technique that suits your lifestyle and personality. These techniques take time and practice to develop. Set aside 5-15 minutes a day to do them. Therapists can offer training in these techniques. Other things you can do to manage stress include:  Keeping a stress diary.  Knowing your limits and saying no when you think something is too much.  Paying attention to how you react to certain situations. You may not be able to control everything, but you can change your reaction.  Adding humor to your life by  watching funny films or TV shows.  Making time for activities that you enjoy and that relax you.   Medicines Medicines, such as antidepressants, are often a part of treatment for depression.  Talk with your pharmacist or health care provider about all the medicines, supplements, and herbal products that you take, their possible side effects, and what medicines and other products are safe to take together.  Make sure to report any side effects you may have to your health care provider. Relationships Your health care provider may suggest family therapy, couples therapy, or individual therapy as part of your treatment. How to recognize changes Everyone responds differently to treatment for depression. As you recover from depression, you may start to:  Have more interest in doing activities.  Feel less hopeless.  Have more energy.  Overeat less often, or have a better appetite.  Have better mental focus. It is important to recognize if your depression is not getting better or is getting worse. The symptoms you had in the beginning may return, such as:  Tiredness (fatigue) or low energy.  Eating too much or too little.  Sleeping too much or too little.  Feeling restless, agitated, or hopeless.  Trouble focusing or making decisions.  Unexplained physical complaints.  Feeling irritable, angry, or aggressive. If you or your family members notice these symptoms coming back, let your health care provider know right away. Follow these instructions at home: Activity  Try to get some form of exercise each day, such as walking, biking, swimming, or lifting weights.  Practice stress reduction techniques.  Engage your mind by taking a class or doing some volunteer work.   Lifestyle  Get the right amount and quality of sleep.    Cut down on using caffeine, tobacco, alcohol, and other potentially harmful substances.  Eat a healthy diet that includes plenty of vegetables, fruits,  whole grains, low-fat dairy products, and lean protein. Do not eat a lot of foods that are high in solid fats, added sugars, or salt (sodium). General instructions  Take over-the-counter and prescription medicines only as told by your health care provider.  Keep all follow-up visits as told by your health care provider. This is important. Where to find support Talking to others Friends and family members can be sources of support and guidance. Talk to trusted friends or family members about your condition. Explain your symptoms to them, and let them know that you are working with a health care provider to treat your depression. Tell friends and family members how they also can be helpful.   Finances  Find appropriate mental health providers that fit with your financial situation.  Talk with your health care provider about options to get reduced prices on your medicines. Where to find more information You can find support in your area from:  Anxiety and Depression Association of America (ADAA): www.adaa.org  Mental Health America: www.mentalhealthamerica.net  National Alliance on Mental Illness: www.nami.org Contact a health care provider if:  You stop taking your antidepressant medicines, and you have any of these symptoms: ? Nausea. ? Headache. ? Light-headedness. ? Chills and body aches. ? Not being able to sleep (insomnia).  You or your friends and family think your depression is getting worse. Get help right away if:  You have thoughts of hurting yourself or others. If you ever feel like you may hurt yourself or others, or have thoughts about taking your own life, get help right away. Go to your nearest emergency department or:  Call your local emergency services (911 in the [place]  Call a suicide crisis helpline, such as the National Suicide Prevention Lifeline at 1-800-273-8255. This is open 24 hours a day in the [place]  Text the Crisis Text Line at 741741 (in the  [place] Summary  If you are diagnosed with depression, preparing yourself to manage your symptoms is a good way to feel positive about your future.  Work with your health care provider on a management plan that includes stress reduction techniques, medicines (if applicable), therapy, and healthy lifestyle habits.  Keep talking with your health care provider about how your treatment is working.  If you have thoughts about taking your own life, call a suicide crisis helpline or text a crisis text line. This information is not intended to replace advice given to you by your health care provider. Make sure you discuss any questions you have with your health care provider. Document Revised: 12/31/2018 Document Reviewed: 12/31/2018 Elsevier Patient Education  2021 Elsevier Inc. Managing Anxiety, Adult After being diagnosed with an anxiety disorder, you may be relieved to know why you have felt or behaved a certain way. You may also feel overwhelmed about the treatment ahead and what it will mean for your life. With care and support, you can manage this condition and recover from it. How to manage lifestyle changes Managing stress and anxiety Stress is your body's reaction to life changes and events, both good and bad. Most stress will last just a few hours, but stress can be ongoing and can lead to more than just stress. Although stress can play a major role in anxiety, it is not the same as anxiety. Stress is usually caused by something external, such as a deadline,   test, or competition. Stress normally passes after the triggering event has ended.  Anxiety is caused by something internal, such as imagining a terrible outcome or worrying that something will go wrong that will devastate you. Anxiety often does not go away even after the triggering event is over, and it can become long-term (chronic) worry. It is important to understand the differences between stress and anxiety and to manage your stress  effectively so that it does not lead to an anxious response. Talk with your health care provider or a counselor to learn more about reducing anxiety and stress. He or she may suggest tension reduction techniques, such as:  Music therapy. This can include creating or listening to music that you enjoy and that inspires you.  Mindfulness-based meditation. This involves being aware of your normal breaths while not trying to control your breathing. It can be done while sitting or walking.  Centering prayer. This involves focusing on a word, phrase, or sacred image that means something to you and brings you peace.  Deep breathing. To do this, expand your stomach and inhale slowly through your nose. Hold your breath for 3-5 seconds. Then exhale slowly, letting your stomach muscles relax.  Self-talk. This involves identifying thought patterns that lead to anxiety reactions and changing those patterns.  Muscle relaxation. This involves tensing muscles and then relaxing them. Choose a tension reduction technique that suits your lifestyle and personality. These techniques take time and practice. Set aside 5-15 minutes a day to do them. Therapists can offer counseling and training in these techniques. The training to help with anxiety may be covered by some insurance plans. Other things you can do to manage stress and anxiety include:  Keeping a stress/anxiety diary. This can help you learn what triggers your reaction and then learn ways to manage your response.  Thinking about how you react to certain situations. You may not be able to control everything, but you can control your response.  Making time for activities that help you relax and not feeling guilty about spending your time in this way.  Visual imagery and yoga can help you stay calm and relax.   Medicines Medicines can help ease symptoms. Medicines for anxiety include:  Anti-anxiety drugs.  Antidepressants. Medicines are often used as a  primary treatment for anxiety disorder. Medicines will be prescribed by a health care provider. When used together, medicines, psychotherapy, and tension reduction techniques may be the most effective treatment. Relationships Relationships can play a big part in helping you recover. Try to spend more time connecting with trusted friends and family members. Consider going to couples counseling, taking family education classes, or going to family therapy. Therapy can help you and others better understand your condition. How to recognize changes in your anxiety Everyone responds differently to treatment for anxiety. Recovery from anxiety happens when symptoms decrease and stop interfering with your daily activities at home or work. This may mean that you will start to:  Have better concentration and focus. Worry will interfere less in your daily thinking.  Sleep better.  Be less irritable.  Have more energy.  Have improved memory. It is important to recognize when your condition is getting worse. Contact your health care provider if your symptoms interfere with home or work and you feel like your condition is not improving. Follow these instructions at home: Activity  Exercise. Most adults should do the following: ? Exercise for at least 150 minutes each week. The exercise should increase your heart   rate and make you sweat (moderate-intensity exercise). ? Strengthening exercises at least twice a week.  Get the right amount and quality of sleep. Most adults need 7-9 hours of sleep each night. Lifestyle  Eat a healthy diet that includes plenty of vegetables, fruits, whole grains, low-fat dairy products, and lean protein. Do not eat a lot of foods that are high in solid fats, added sugars, or salt.  Make choices that simplify your life.  Do not use any products that contain nicotine or tobacco, such as cigarettes, e-cigarettes, and chewing tobacco. If you need help quitting, ask your health  care provider.  Avoid caffeine, alcohol, and certain over-the-counter cold medicines. These may make you feel worse. Ask your pharmacist which medicines to avoid.   General instructions  Take over-the-counter and prescription medicines only as told by your health care provider.  Keep all follow-up visits as told by your health care provider. This is important. Where to find support You can get help and support from these sources:  Self-help groups.  Online and community organizations.  A trusted spiritual leader.  Couples counseling.  Family education classes.  Family therapy. Where to find more information You may find that joining a support group helps you deal with your anxiety. The following sources can help you locate counselors or support groups near you:  Mental Health America: www.mentalhealthamerica.net  Anxiety and Depression Association of America (ADAA): www.adaa.org  National Alliance on Mental Illness (NAMI): www.nami.org Contact a health care provider if you:  Have a hard time staying focused or finishing daily tasks.  Spend many hours a day feeling worried about everyday life.  Become exhausted by worry.  Start to have headaches, feel tense, or have nausea.  Urinate more than normal.  Have diarrhea. Get help right away if you have:  A racing heart and shortness of breath.  Thoughts of hurting yourself or others. If you ever feel like you may hurt yourself or others, or have thoughts about taking your own life, get help right away. You can go to your nearest emergency department or call:  Your local emergency services (911 in the [place]  A suicide crisis helpline, such as the National Suicide Prevention Lifeline at 1-800-273-8255. This is open 24 hours a day. Summary  Taking steps to learn and use tension reduction techniques can help calm you and help prevent triggering an anxiety reaction.  When used together, medicines, psychotherapy, and  tension reduction techniques may be the most effective treatment.  Family, friends, and partners can play a big part in helping you recover from an anxiety disorder. This information is not intended to replace advice given to you by your health care provider. Make sure you discuss any questions you have with your health care provider. Document Revised: 07/22/2018 Document Reviewed: 07/22/2018 Elsevier Patient Education  2021 Elsevier Inc.  

## 2020-04-14 ENCOUNTER — Encounter: Payer: Self-pay | Admitting: Family Medicine

## 2020-04-14 ENCOUNTER — Other Ambulatory Visit: Payer: Self-pay

## 2020-04-14 ENCOUNTER — Ambulatory Visit (INDEPENDENT_AMBULATORY_CARE_PROVIDER_SITE_OTHER): Payer: Managed Care, Other (non HMO) | Admitting: Family Medicine

## 2020-04-14 VITALS — BP 122/82 | HR 65 | Temp 97.6°F | Ht 70.0 in | Wt 198.0 lb

## 2020-04-14 DIAGNOSIS — F5102 Adjustment insomnia: Secondary | ICD-10-CM | POA: Diagnosis not present

## 2020-04-14 DIAGNOSIS — F321 Major depressive disorder, single episode, moderate: Secondary | ICD-10-CM

## 2020-04-14 MED ORDER — DULOXETINE HCL 60 MG PO CPEP: 60.0000 mg | ORAL_CAPSULE | Freq: Every day | ORAL | 2 refills | Status: DC

## 2020-04-14 NOTE — Progress Notes (Signed)
Subjective:  Patient ID: Alex Joseph, male    DOB: Aug 08, 1990  Age: 30 y.o. MRN: [id number]  CC: Medical Management of Chronic Issues (4 week Depression recheck/)   HPI   Alex Joseph presents for continued problems with anxiety and depression.  GAD and PHQ scores are noted below.  They are actually somewhat higher than they were at his previous visit.  He is specifically concerned about the loss of energy and poor sleep.  His work schedule is a 2-3 rotation where he works nights.  He is only getting 3 to 5 hours of sleep a day.   GAD 7 : Generalized Anxiety Score 04/14/2020 03/17/2020  Nervous, Anxious, on Edge 1 1  Control/stop worrying 2 2  Worry too much - different things 2 1  Trouble relaxing 2 1  Restless 0 0  Easily annoyed or irritable 1 1  Afraid - awful might happen 0 0  Total GAD 7 Score 8 6  Anxiety Difficulty Somewhat difficult Somewhat difficult      Depression screen [facility] 2/9 04/14/2020 03/17/2020 03/26/2018  Decreased Interest 2 3 0  Down, Depressed, Hopeless 1 2 0  PHQ - 2 Score 3 5 0  Altered sleeping 3 1 -  Tired, decreased energy 3 1 -  Change in appetite 0 1 -  Feeling bad or failure about yourself  0 0 -  Trouble concentrating 1 1 -  Moving slowly or fidgety/restless 0 0 -  Suicidal thoughts 0 0 -  PHQ-9 Score 10 9 -  Difficult doing work/chores Somewhat difficult Very difficult -    History Alex Joseph has no past medical history on file.   He has no past surgical history on file.   His family history is not on file.He reports that he quit smoking about 5 years ago. His smoking use included cigarettes. He started smoking about 8 years ago. He smoked 0.25 packs per day. He has quit using smokeless tobacco. He reports current alcohol use of about 15.0 standard drinks of alcohol per week. He reports that he does not use drugs.    ROS Review of Systems  Constitutional: Positive for fatigue. Negative for fever.  Respiratory: Negative for shortness of  breath.   Cardiovascular: Negative for chest pain.  Musculoskeletal: Negative for arthralgias.  Skin: Negative for rash.  Psychiatric/Behavioral: Positive for dysphoric mood and sleep disturbance. The patient is nervous/anxious.     Objective:  BP 122/82   Pulse 65   Temp 97.6 F (36.4 C) (Temporal)   Ht 5\' 10"  (1.778 m)   Wt 198 lb (89.8 kg)   BMI 28.41 kg/m   BP Readings from Last 3 Encounters:  04/14/20 122/82  03/17/20 132/84  03/26/18 93/71    Wt Readings from Last 3 Encounters:  04/14/20 198 lb (89.8 kg)  03/17/20 197 lb 4 oz (89.5 kg)  03/26/18 203 lb (92.1 kg)     Physical Exam Vitals reviewed.  Constitutional:      Appearance: He is well-developed and well-nourished.  HENT:     Head: Normocephalic and atraumatic.     Right Ear: Tympanic membrane and external ear normal. No decreased hearing noted.     Left Ear: Tympanic membrane and external ear normal. No decreased hearing noted.     Mouth/Throat:     Pharynx: No oropharyngeal exudate or posterior oropharyngeal erythema.  Eyes:     Pupils: Pupils are equal, round, and reactive to light.  Cardiovascular:     Rate and Rhythm:  Normal rate and regular rhythm.     Heart sounds: No murmur heard.   Pulmonary:     Effort: No respiratory distress.     Breath sounds: Normal breath sounds.  Abdominal:     General: Bowel sounds are normal.     Palpations: Abdomen is soft. There is no mass.     Tenderness: There is no abdominal tenderness.  Musculoskeletal:     Cervical back: Normal range of motion and neck supple.       Assessment & Plan:   Alex Joseph was seen today for medical management of chronic issues.  Diagnoses and all orders for this visit:  Depression, major, single episode, moderate (HCC)  Adjustment insomnia  Other orders -     DULoxetine (CYMBALTA) 60 MG capsule; Take 1 capsule (60 mg total) by mouth daily.    He should discontinue the citalopram as having been ineffective for him.  I  do not think that it is going to be necessary to titrate the duloxetine since he has been taking the citalopram up to presentation today.  He can just make a one-to-one substitution.  I did request he takes the duloxetine with a protein meal in his stomach preferably in the evening or his last meal before bedtime when working nights.   I have discontinued Alex Joseph's citalopram. I am also having him start on DULoxetine.  Allergies as of 04/14/2020      Reactions   Cefzil [cefprozil]       Medication List       Accurate as of April 14, 2020 11:59 PM. If you have any questions, ask your nurse or doctor.        STOP taking these medications   citalopram 20 MG tablet Commonly known as: CeleXA Stopped by: Mechele Claude, MD     TAKE these medications   DULoxetine 60 MG capsule Commonly known as: Cymbalta Take 1 capsule (60 mg total) by mouth daily. Started by: Mechele Claude, MD        Follow-up: Return in 1 month (on 05/12/2020) for [facility] physical.  Mechele Claude, M.D.

## 2020-04-16 ENCOUNTER — Encounter: Payer: Self-pay | Admitting: Family Medicine

## 2020-07-21 ENCOUNTER — Other Ambulatory Visit: Payer: Self-pay | Admitting: Family Medicine

## 2021-07-28 ENCOUNTER — Ambulatory Visit (INDEPENDENT_AMBULATORY_CARE_PROVIDER_SITE_OTHER): Payer: BLUE CROSS/BLUE SHIELD

## 2021-07-28 ENCOUNTER — Encounter: Payer: Self-pay | Admitting: Family Medicine

## 2021-07-28 ENCOUNTER — Ambulatory Visit (INDEPENDENT_AMBULATORY_CARE_PROVIDER_SITE_OTHER): Payer: BLUE CROSS/BLUE SHIELD | Admitting: Family Medicine

## 2021-07-28 DIAGNOSIS — S61512A Laceration without foreign body of left wrist, initial encounter: Secondary | ICD-10-CM

## 2021-07-28 DIAGNOSIS — R0781 Pleurodynia: Secondary | ICD-10-CM

## 2021-07-28 DIAGNOSIS — S80211A Abrasion, right knee, initial encounter: Secondary | ICD-10-CM

## 2021-07-28 DIAGNOSIS — S8001XA Contusion of right knee, initial encounter: Secondary | ICD-10-CM

## 2021-07-28 DIAGNOSIS — Z23 Encounter for immunization: Secondary | ICD-10-CM | POA: Diagnosis not present

## 2021-07-28 DIAGNOSIS — S2232XA Fracture of one rib, left side, initial encounter for closed fracture: Secondary | ICD-10-CM

## 2021-07-28 MED ORDER — NAPROXEN 500 MG PO TABS: 500.0000 mg | ORAL_TABLET | Freq: Two times a day (BID) | ORAL | 1 refills | Status: AC
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
## 2024-01-07 IMAGING — DX DG RIBS W/ CHEST 3+V*L*
5 series · 5 of 5 positions shown · non-contrast
Comparison: None Available.

CLINICAL DATA: Left-sided chest wall pain

EXAM:
LEFT RIBS AND CHEST - 3+ VIEW

[rib obl (1 of 2)]
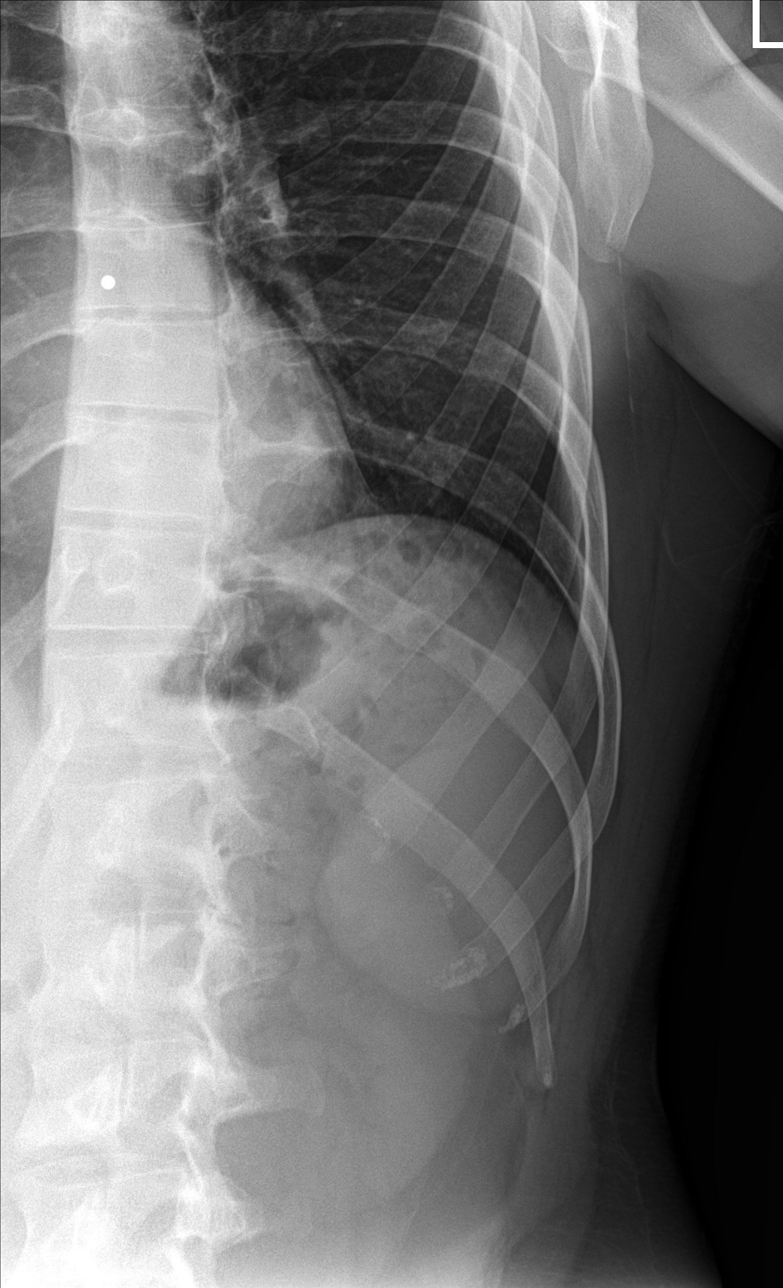

[rib obl (2 of 2)]
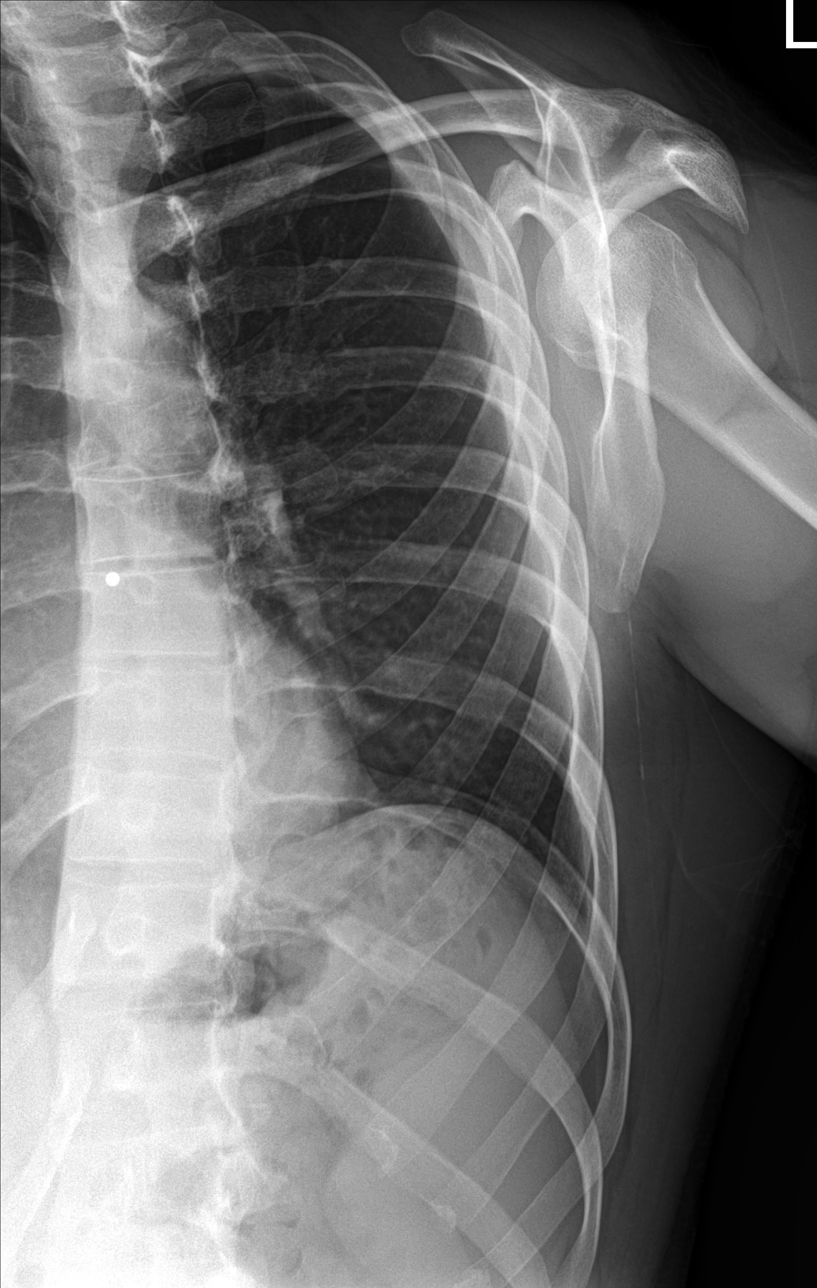

[rib pa (1 of 2)]
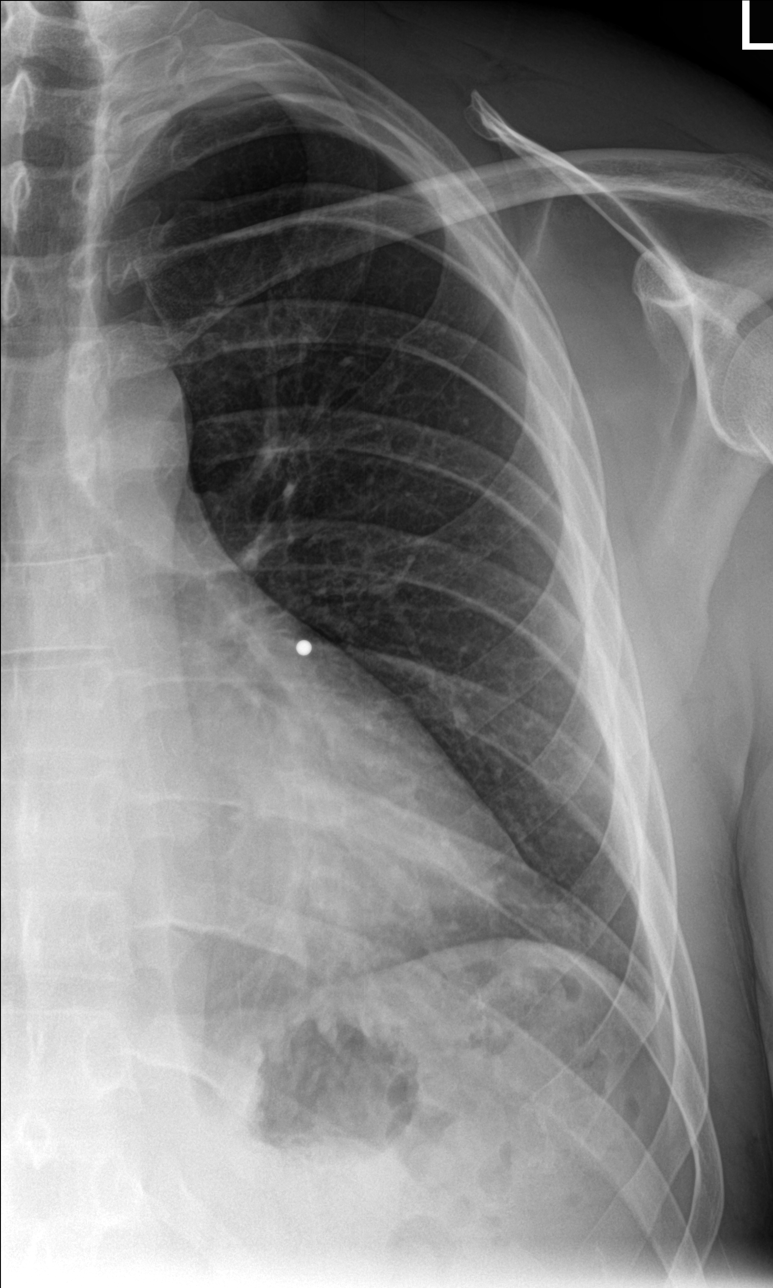

[chest pa]
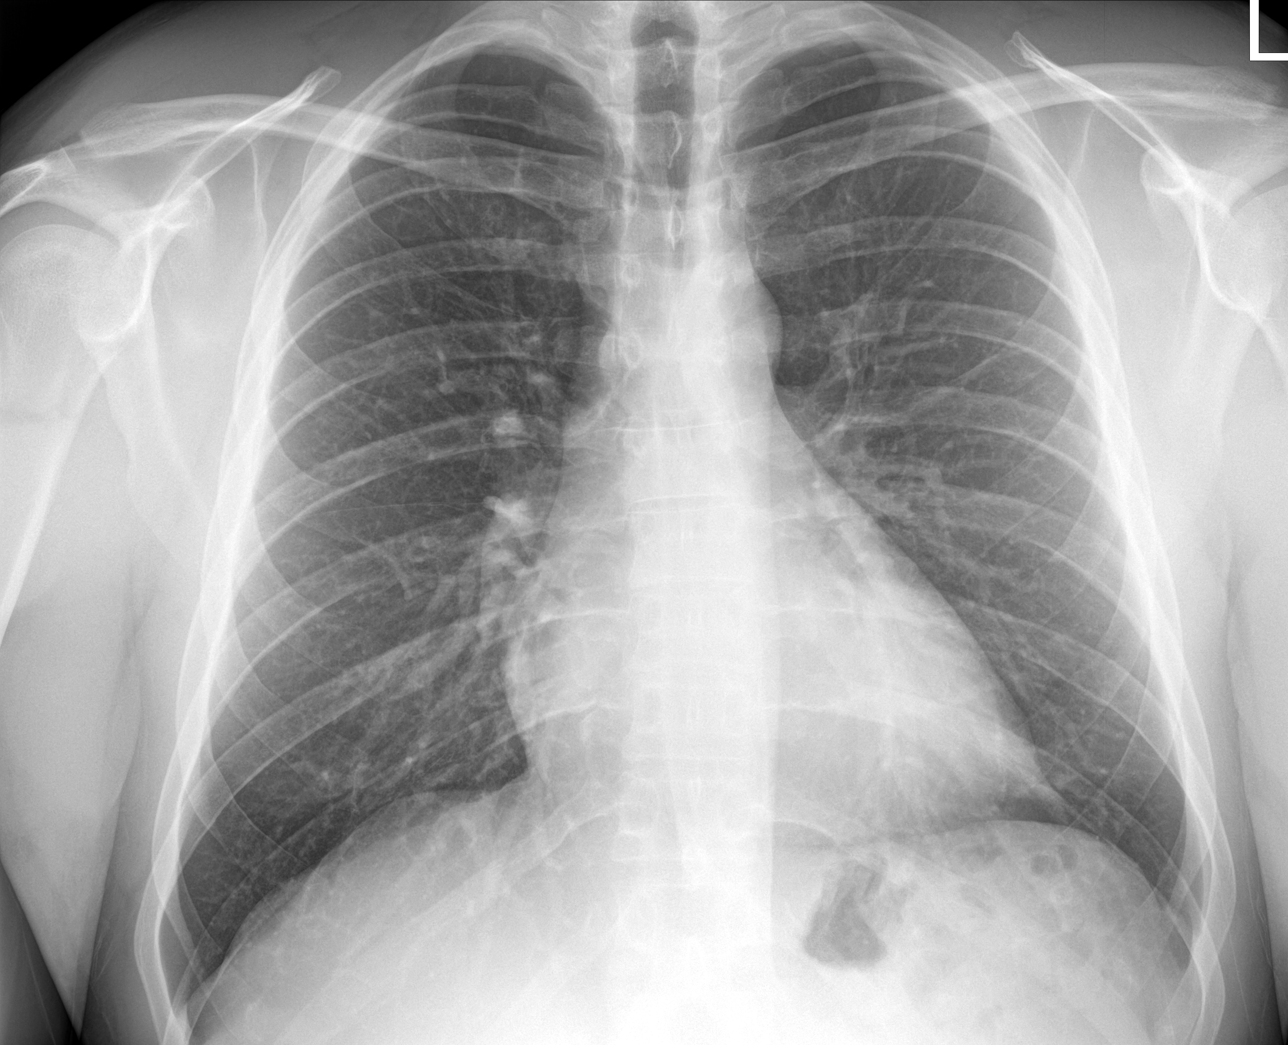

[rib pa (2 of 2)]
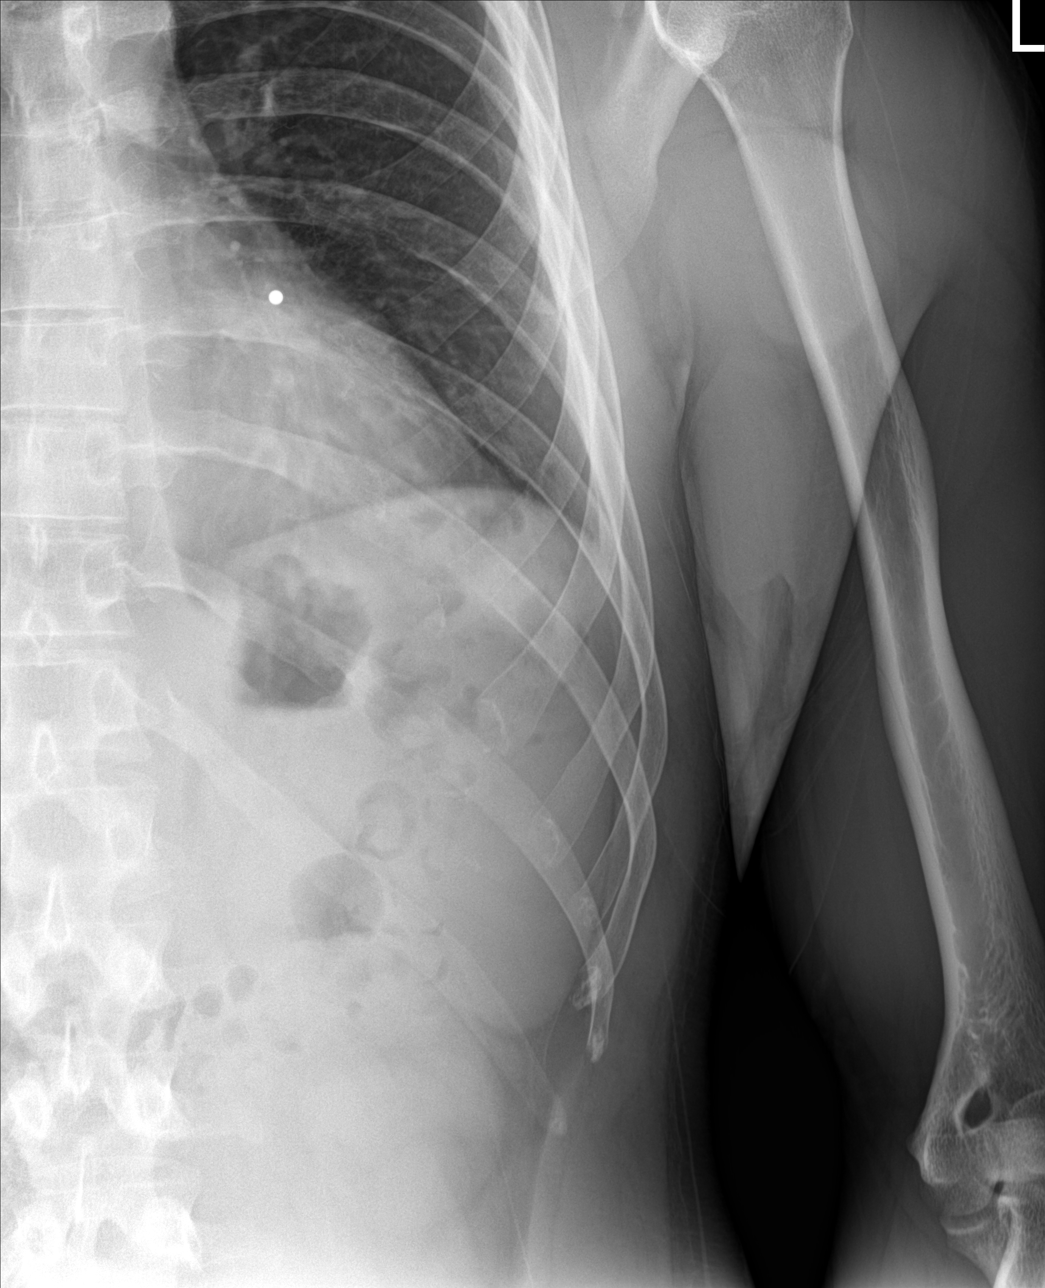

[5 of 5 positions shown; findings below may reference images not displayed]

FINDINGS: Cardiomediastinal silhouette is within normal limits. There is no
focal airspace disease. There is no pleural effusion. There is no
pneumothorax. There is no acute osseous abnormality. Specifically,
there is no evidence of displaced rib fracture.
IMPRESSION: No evidence of displaced rib fracture. No acute cardiopulmonary
disease.
